# Patient Record
Sex: Female | Born: 1990 | Race: White | Hispanic: No | Marital: Single
Health system: Southern US, Community
[De-identification: ages and names within clinical notes are randomized; demographics above are authoritative.]

---

## 2019-04-17 DIAGNOSIS — E559 Vitamin D deficiency, unspecified: Secondary | ICD-10-CM | POA: Insufficient documentation

## 2019-11-14 ENCOUNTER — Ambulatory Visit: Payer: Commercial Managed Care - PPO | Admitting: Podiatry

## 2019-11-21 ENCOUNTER — Encounter: Payer: Self-pay | Admitting: Podiatry

## 2019-11-21 ENCOUNTER — Ambulatory Visit (INDEPENDENT_AMBULATORY_CARE_PROVIDER_SITE_OTHER): Payer: Commercial Managed Care - PPO

## 2019-11-21 ENCOUNTER — Other Ambulatory Visit: Payer: Self-pay

## 2019-11-21 ENCOUNTER — Ambulatory Visit (INDEPENDENT_AMBULATORY_CARE_PROVIDER_SITE_OTHER): Payer: Commercial Managed Care - PPO | Admitting: Podiatry

## 2019-11-21 DIAGNOSIS — M722 Plantar fascial fibromatosis: Secondary | ICD-10-CM

## 2019-11-21 DIAGNOSIS — M79671 Pain in right foot: Secondary | ICD-10-CM

## 2019-11-21 DIAGNOSIS — M79672 Pain in left foot: Secondary | ICD-10-CM

## 2019-11-21 DIAGNOSIS — D1724 Benign lipomatous neoplasm of skin and subcutaneous tissue of left leg: Secondary | ICD-10-CM

## 2019-11-21 DIAGNOSIS — D1723 Benign lipomatous neoplasm of skin and subcutaneous tissue of right leg: Secondary | ICD-10-CM | POA: Diagnosis not present

## 2019-11-21 MED ORDER — METHYLPREDNISOLONE 4 MG PO TBPK: ORAL_TABLET | ORAL | 0 refills | Status: DC

## 2019-11-21 NOTE — Progress Notes (Signed)
  Subjective:  Patient ID: Alexis Ortiz, female    DOB: 04/03/1990,  MRN: [id number]  Chief Complaint  Patient presents with  . Foot Problem    Bilateral medial painful masses 3 week duration no known injuries.   29 y.o. female presents with the above complaint. History confirmed with patient.   Objective:  Physical Exam: warm, good capillary refill, no trophic changes or ulcerative lesions, normal DP and PT pulses and normal sensory exam. Left Foot: POP medial calc tuber, large mass medial arch with fatty septae.  Right Foot: POP medial calc tuber, large mass medial arch with fatty septae.    No images are attached to the encounter.  Radiographs: X-ray of both feet: no fracture, dislocation, swelling or degenerative changes noted Assessment:   1. Pain in left foot   2. Pain in right foot   3. Lipoma of left lower extremity      Plan:  Patient was evaluated and treated and all questions answered.  Lipoma left foot -Order Ultrasound for further eval   Plantar fasciitis - Rx Medrol pack however do not be taken until after her ultrasound  Return in about 3 weeks (around 12/12/2019).

## 2019-11-27 ENCOUNTER — Inpatient Hospital Stay: Admission: RE | Admit: 2019-11-27 | Payer: Commercial Managed Care - PPO | Source: Ambulatory Visit

## 2019-11-28 ENCOUNTER — Ambulatory Visit
Admission: RE | Admit: 2019-11-28 | Discharge: 2019-11-28 | Disposition: A | Discharge status: Home / Self Care | Payer: Commercial Managed Care - PPO | Source: Ambulatory Visit | Attending: Podiatry | Admitting: Podiatry

## 2019-11-28 ENCOUNTER — Encounter: Payer: Self-pay | Admitting: General Practice

## 2019-11-28 DIAGNOSIS — D1724 Benign lipomatous neoplasm of skin and subcutaneous tissue of left leg: Secondary | ICD-10-CM

## 2019-12-02 ENCOUNTER — Encounter: Payer: Self-pay | Admitting: Podiatry

## 2019-12-02 ENCOUNTER — Ambulatory Visit (INDEPENDENT_AMBULATORY_CARE_PROVIDER_SITE_OTHER): Payer: Commercial Managed Care - PPO | Admitting: Podiatry

## 2019-12-02 ENCOUNTER — Other Ambulatory Visit: Payer: Self-pay

## 2019-12-02 DIAGNOSIS — M722 Plantar fascial fibromatosis: Secondary | ICD-10-CM | POA: Diagnosis not present

## 2019-12-02 DIAGNOSIS — M79672 Pain in left foot: Secondary | ICD-10-CM

## 2019-12-02 DIAGNOSIS — D1724 Benign lipomatous neoplasm of skin and subcutaneous tissue of left leg: Secondary | ICD-10-CM

## 2019-12-02 NOTE — Progress Notes (Addendum)
  Subjective:  Patient ID: Alexis Ortiz, female    DOB: 1990-12-09,  MRN: [id number]  No chief complaint on file.   29 y.o. female presents with the above complaint. History confirmed with patient. Here for [place] review and furhter care. States the heel still hurts and the medication didn't help that much.  Objective:  Physical Exam: warm, good capillary refill, no trophic changes or ulcerative lesions, normal DP and PT pulses and normal sensory exam. Left Foot: POP left midfoot soft-tissue mass  Right Foot: soft tisuse mass at medial arch   Assessment:   1. Lipoma of left lower extremity   2. Pain in left foot   3. Plantar fasciitis    Plan:  Patient was evaluated and treated and all questions answered.  Lipoma left foot -[place] reviewed again with patient. -Discussed proceeding with removal. Patient would like to proceed. -Patient has failed all conservative therapy and wishes to proceed with surgical intervention. All risks, benefits, and alternatives discussed with patient. No guarantees given. Consent reviewed and signed by patient. -Planned procedures: left foot excision of lipoma, injection of plantar fascia.   Plantar fasciitis -Not improved with the medrol pack. Discussed injection at same time of excision of mass. I think it is too early to consider surgery for this issue as we have not maximized conservative care.  No follow-ups on file.

## 2019-12-02 NOTE — Addendum Note (Signed)
Addended by: Hardie Pulley on: 12/02/2019 12:14 PM   Modules accepted: Level of Service

## 2019-12-03 ENCOUNTER — Telehealth: Payer: Self-pay

## 2019-12-03 ENCOUNTER — Encounter: Payer: Self-pay | Admitting: Podiatry

## 2019-12-03 NOTE — Telephone Encounter (Signed)
DOS 12/11/2019  EXC GANGLION/TUMOR LT - 95638 INJECTION HEEL LT - 20550  UMR EFFECTIVE DATE - 07/20/2019  PLAN DEDUCTIBLE - $2500.00 W/ $2500.00 REMAINING OUT OF POCKET - $5500.00 W/ $5500.00 REMAINING COPAY $0.00 COINSURANCE - 20%  PER JAY AY UMR NO PRECERT IS REQUIRED FOR CPT 28090 OR 75643. CALL REF # [id number]

## 2019-12-11 ENCOUNTER — Other Ambulatory Visit: Payer: Self-pay | Admitting: Podiatry

## 2019-12-11 ENCOUNTER — Encounter: Payer: Self-pay | Admitting: Podiatry

## 2019-12-11 ENCOUNTER — Other Ambulatory Visit: Payer: Self-pay | Admitting: Sports Medicine

## 2019-12-11 ENCOUNTER — Telehealth: Payer: Self-pay | Admitting: Podiatry

## 2019-12-11 DIAGNOSIS — M67472 Ganglion, left ankle and foot: Secondary | ICD-10-CM | POA: Diagnosis not present

## 2019-12-11 DIAGNOSIS — Z9889 Other specified postprocedural states: Secondary | ICD-10-CM

## 2019-12-11 DIAGNOSIS — M722 Plantar fascial fibromatosis: Secondary | ICD-10-CM | POA: Diagnosis not present

## 2019-12-11 MED ORDER — ONDANSETRON HCL 4 MG PO TABS: 4.0000 mg | ORAL_TABLET | Freq: Three times a day (TID) | ORAL | 0 refills | Status: DC | PRN

## 2019-12-11 MED ORDER — CEPHALEXIN 500 MG PO CAPS: 500.0000 mg | ORAL_CAPSULE | Freq: Two times a day (BID) | ORAL | 0 refills | Status: DC

## 2019-12-11 MED ORDER — HYDROCODONE-ACETAMINOPHEN 5-325 MG PO TABS: 1.0000 | ORAL_TABLET | Freq: Four times a day (QID) | ORAL | 0 refills | Status: DC | PRN

## 2019-12-11 MED ORDER — HYDROCODONE-ACETAMINOPHEN 5-325 MG PO TABS
1.0000 | ORAL_TABLET | Freq: Four times a day (QID) | ORAL | 0 refills | Status: DC | PRN
Start: 2019-12-11 — End: 2019-12-11

## 2019-12-11 MED ORDER — OXYCODONE-ACETAMINOPHEN 5-325 MG PO TABS: 1.0000 | ORAL_TABLET | ORAL | 0 refills | Status: DC | PRN

## 2019-12-11 NOTE — Progress Notes (Signed)
Resent post op meds to her pharmacy  -Dr. Cannon Kettle

## 2019-12-11 NOTE — Progress Notes (Signed)
Rx sent to pharmacy for outpatient surgery. °

## 2019-12-12 ENCOUNTER — Ambulatory Visit: Payer: Commercial Managed Care - PPO | Admitting: Podiatry

## 2019-12-12 ENCOUNTER — Telehealth: Payer: Self-pay | Admitting: Podiatry

## 2019-12-12 DIAGNOSIS — Z9889 Other specified postprocedural states: Secondary | ICD-10-CM

## 2019-12-12 MED ORDER — ONDANSETRON HCL 8 MG PO TABS: 8.0000 mg | ORAL_TABLET | Freq: Three times a day (TID) | ORAL | 0 refills | Status: DC | PRN

## 2019-12-12 MED ORDER — IBUPROFEN 800 MG PO TABS: 800.0000 mg | ORAL_TABLET | Freq: Three times a day (TID) | ORAL | 0 refills | Status: DC | PRN

## 2019-12-12 NOTE — Addendum Note (Signed)
Addended by: Hardie Pulley on: 12/12/2019 10:16 AM   Modules accepted: Orders

## 2019-12-12 NOTE — Telephone Encounter (Signed)
Patient said as soon as she started taking the prescribed medication she has been throwing up and was sick all night. She believes it the medication. Are there other options? She would like to switch pharmacy to [facility] in [place] on [address].

## 2019-12-15 ENCOUNTER — Telehealth: Payer: Self-pay | Admitting: Podiatry

## 2019-12-15 ENCOUNTER — Encounter: Payer: Self-pay | Admitting: Podiatry

## 2019-12-15 NOTE — Telephone Encounter (Signed)
Alexis Ortiz would like a note, to remain out of work until her next visit. She thought she would be able to go back to work, but she is still having pain. Please advise?

## 2019-12-15 NOTE — Telephone Encounter (Signed)
I'm fine with that. Let's keep her out 2 weeks

## 2019-12-16 ENCOUNTER — Ambulatory Visit: Payer: Commercial Managed Care - PPO | Admitting: Podiatry

## 2019-12-16 ENCOUNTER — Telehealth: Payer: Self-pay | Admitting: Podiatry

## 2019-12-16 NOTE — Telephone Encounter (Signed)
Pharmacy called stating they received an extra order for the oxyCODONE 5-325 MG. They would like to know if they need to fill it. Please advise.

## 2019-12-16 NOTE — Telephone Encounter (Signed)
Called and spoke to pharmacy no current issues with her pain medication rxes

## 2019-12-18 ENCOUNTER — Encounter: Payer: Self-pay | Admitting: Podiatry

## 2019-12-19 ENCOUNTER — Other Ambulatory Visit: Payer: Self-pay

## 2019-12-19 ENCOUNTER — Encounter: Payer: Self-pay | Admitting: Podiatry

## 2019-12-19 ENCOUNTER — Ambulatory Visit (INDEPENDENT_AMBULATORY_CARE_PROVIDER_SITE_OTHER): Payer: Commercial Managed Care - PPO | Admitting: Podiatry

## 2019-12-19 DIAGNOSIS — Z9889 Other specified postprocedural states: Secondary | ICD-10-CM

## 2019-12-19 DIAGNOSIS — D1724 Benign lipomatous neoplasm of skin and subcutaneous tissue of left leg: Secondary | ICD-10-CM

## 2019-12-19 MED ORDER — OXYCODONE-ACETAMINOPHEN 5-325 MG PO TABS
1.0000 | ORAL_TABLET | ORAL | 0 refills | Status: DC | PRN
Start: 2019-12-19 — End: 2020-06-09

## 2019-12-26 ENCOUNTER — Other Ambulatory Visit: Payer: Self-pay

## 2019-12-26 ENCOUNTER — Ambulatory Visit (INDEPENDENT_AMBULATORY_CARE_PROVIDER_SITE_OTHER): Payer: Commercial Managed Care - PPO | Admitting: Podiatry

## 2019-12-26 DIAGNOSIS — M722 Plantar fascial fibromatosis: Secondary | ICD-10-CM | POA: Diagnosis not present

## 2019-12-26 MED ORDER — MELOXICAM 15 MG PO TABS: 15.0000 mg | ORAL_TABLET | Freq: Every day | ORAL | 0 refills | Status: DC

## 2020-01-05 ENCOUNTER — Encounter: Payer: Self-pay | Admitting: Podiatry

## 2020-01-05 ENCOUNTER — Ambulatory Visit (INDEPENDENT_AMBULATORY_CARE_PROVIDER_SITE_OTHER): Payer: Commercial Managed Care - PPO | Admitting: Podiatry

## 2020-01-05 ENCOUNTER — Other Ambulatory Visit: Payer: Self-pay

## 2020-01-05 DIAGNOSIS — D1724 Benign lipomatous neoplasm of skin and subcutaneous tissue of left leg: Secondary | ICD-10-CM

## 2020-01-05 DIAGNOSIS — Z9889 Other specified postprocedural states: Secondary | ICD-10-CM

## 2020-01-05 MED ORDER — HYDROCODONE-ACETAMINOPHEN 5-325 MG PO TABS
0.5000 | ORAL_TABLET | Freq: Four times a day (QID) | ORAL | 0 refills | Status: DC | PRN
Start: 2020-01-05 — End: 2020-06-10

## 2020-01-09 ENCOUNTER — Encounter: Payer: Commercial Managed Care - PPO | Admitting: Podiatry

## 2020-01-11 NOTE — Progress Notes (Signed)
°  Subjective:  Patient ID: Alexis Ortiz, female    DOB: September 11, 1990,  MRN: [id number]  Chief Complaint  Patient presents with   Routine Post Op    the stitches are still in there and itches on the left     DOS: 12/11/19 Procedure: Excision of lipoma left foot, injection left heel   29 y.o. female presents with the above complaint.  States that the stitches are very itchy and she is ready for them to come out  Objective:  Physical Exam: tenderness at the surgical site, local edema noted and calf supple, nontender.  Pain palpation about the medial calc tuber Incision: healing well, no significant drainage, no dehiscence, no significant erythema    Assessment:   1. S/P foot surgery   2. Lipoma of left lower extremity     Plan:  Patient was evaluated and treated and all questions answered.  Post-operative State -Sutures removed -Steri-strips applied to the incision -WBAT in Surgical shoe -Pain medication refilled  Return in about 3 weeks (around 01/26/2020).

## 2020-01-11 NOTE — Progress Notes (Signed)
°  Subjective:  Patient ID: Alexis Ortiz, female    DOB: [birth year],  MRN: [id number]  Chief Complaint  Patient presents with   Routine Post Op    POV#2 DOS 9.30.2021 EXCISION GANGLION/TUMOR, INJECTION LT HEEL. Pt states surgical site is doing okay but primary concern is heel pain today. Denies fever/nausea/vomiting/chills.    DOS: 12/11/19 Procedure: Excision of lipoma left foot, injection left heel   29 y.o. female presents with the above complaint. History confirmed with patient.  States that the heel hurts more than the surgical site  Objective:  Physical Exam: tenderness at the surgical site, local edema noted and calf supple, nontender.  Pain palpation about the medial calc tuber Incision: healing well, no significant drainage, no dehiscence, no significant erythema    Assessment:   1. Plantar fasciitis     Plan:  Patient was evaluated and treated and all questions answered.  Post-operative State -Ok to start showering at this time. Advised they cannot soak. -Dressing applied consisting of band-aid -WBAT in Surgical shoe -Rx meloxicam  No follow-ups on file.

## 2020-01-11 NOTE — Progress Notes (Signed)
  Subjective:  Patient ID: Alexis Ortiz, female    DOB: 1990-06-03,  MRN: [id number]  Chief Complaint  Patient presents with  . Routine Post Op    POV#1 DOS 9.30.2021 EXCISION GANGLION/TUMOR, INJECTION LT HEEL. Pt states some tenderness. Denies fever/nausea/vomiting/chills.    DOS: 12/11/19 Procedure: Excision of lipoma left foot, injection left heel   29 y.o. female presents with the above complaint. History confirmed with patient.  States that the area is still tender and hurts pain otherwise mostly controlled.  Request refill of pain medication today  Objective:  Physical Exam: tenderness at the surgical site, local edema noted and calf supple, nontender. Incision: healing well, no significant drainage, no dehiscence, no significant erythema    Assessment:   1. S/P foot surgery   2. Lipoma of left lower extremity     Plan:  Patient was evaluated and treated and all questions answered.  Post-operative State -Dressing applied consisting of sterile gauze, kerlix and ACE bandage -WBAT in Surgical shoe -Pain medication refilled  Return in about 1 week (around 12/26/2019).

## 2020-01-15 ENCOUNTER — Encounter: Payer: Commercial Managed Care - PPO | Admitting: Podiatry

## 2020-01-26 ENCOUNTER — Encounter: Payer: Commercial Managed Care - PPO | Admitting: Podiatry

## 2020-01-26 ENCOUNTER — Encounter: Payer: Self-pay | Admitting: Podiatry

## 2020-01-26 ENCOUNTER — Ambulatory Visit (INDEPENDENT_AMBULATORY_CARE_PROVIDER_SITE_OTHER): Payer: Commercial Managed Care - PPO | Admitting: Podiatry

## 2020-01-26 ENCOUNTER — Other Ambulatory Visit: Payer: Self-pay

## 2020-01-26 DIAGNOSIS — M79671 Pain in right foot: Secondary | ICD-10-CM | POA: Diagnosis not present

## 2020-01-26 DIAGNOSIS — M79672 Pain in left foot: Secondary | ICD-10-CM | POA: Diagnosis not present

## 2020-01-26 DIAGNOSIS — M216X9 Other acquired deformities of unspecified foot: Secondary | ICD-10-CM | POA: Diagnosis not present

## 2020-01-26 DIAGNOSIS — M722 Plantar fascial fibromatosis: Secondary | ICD-10-CM | POA: Diagnosis not present

## 2020-01-29 ENCOUNTER — Other Ambulatory Visit: Payer: Self-pay | Admitting: Podiatry

## 2020-01-29 DIAGNOSIS — M722 Plantar fascial fibromatosis: Secondary | ICD-10-CM

## 2020-01-29 DIAGNOSIS — D1723 Benign lipomatous neoplasm of skin and subcutaneous tissue of right leg: Secondary | ICD-10-CM

## 2020-01-30 ENCOUNTER — Encounter: Payer: Self-pay | Admitting: Podiatry

## 2020-02-11 NOTE — Progress Notes (Signed)
  Subjective:  Patient ID: Alexis Ortiz, female    DOB: January 02, 1991,  MRN: [id number]  Chief Complaint  Patient presents with  . Routine Post Op    the left heel is still hurting and it feels like a pin in it and the injection did help    DOS: 12/11/19 Procedure: Excision of lipoma left foot, injection left heel   29 y.o. female presents with the above complaint.  Objective:  Physical Exam: tenderness at the surgical site, local edema noted and calf supple, nontender.  Pain palpation about the medial calc tuber Incision: healing well, no significant drainage, no dehiscence, no significant erythema  Assessment:   1. Plantar fasciitis   2. Pain in left foot   3. Pain in right foot   4. Equinus deformity of foot    Plan:  Patient was evaluated and treated and all questions answered.  Plantar fasciitis b/l -Still having pain in the left heel, would like to consider other options -Patient has failed all conservative therapy and wishes to proceed with surgical intervention. All risks, benefits, and alternatives discussed with patient. No guarantees given. Consent reviewed and signed by patient. -Planned procedures: Endoscopic plantar fasciotomy left, PRP injection bilat -Identified risk factors: none -DME dispensed for post-op use: none   No follow-ups on file.

## 2020-02-12 ENCOUNTER — Telehealth: Payer: Self-pay | Admitting: Podiatry

## 2020-02-12 NOTE — Telephone Encounter (Signed)
DOS: 02/25/2020  Procedures: Endoscopic Plantar Fasciotomy Lt (44715), Platelet Rich Plasma B/L (8063E), and Injections B/L (68548)  UMR/[employer] Effective From 07/20/2019 -  Deductible: $2,500 with $1,525.18 met and $974.82 remains. Out of Pocket: $5,500 with $1,737.56 met and $3,762.44 remains. CoInsurance: 20% Copay: $  Per Sam D No Prior Authorization or Referrals are required. However, 0232T is not covered as it is considered experimental treatment. Call reference [phone]

## 2020-02-13 ENCOUNTER — Telehealth: Payer: Self-pay

## 2020-02-13 NOTE — Telephone Encounter (Signed)
Noted thanks °

## 2020-02-13 NOTE — Telephone Encounter (Signed)
[place] called needing to reschedule her surgery with Dr. March Rummage on 02/25/2020. She stated her ride can't get off work that day. We rescheduled her to 03/10/2020. Notified Cynthia with [facility] and Dr. March Rummage

## 2020-02-23 ENCOUNTER — Telehealth: Payer: Self-pay | Admitting: Podiatry

## 2020-02-23 ENCOUNTER — Encounter: Payer: Self-pay | Admitting: Podiatry

## 2020-02-23 NOTE — Telephone Encounter (Signed)
Pt is requesting a work note for sx starting 03/08/2010 to 1\01/2021.

## 2020-02-23 NOTE — Telephone Encounter (Signed)
I'm fine with that Darrick Penna do you mind writing it?

## 2020-03-02 ENCOUNTER — Encounter: Payer: Commercial Managed Care - PPO | Admitting: Podiatry

## 2020-03-08 ENCOUNTER — Telehealth: Payer: Self-pay

## 2020-03-08 NOTE — Telephone Encounter (Signed)
Thanks for the update

## 2020-03-08 NOTE — Telephone Encounter (Signed)
Received call from [place] to reschedule her surgery with Dr. Samuella Cota on 03/10/2020. She tested positive for COVID on 03/05/2020. We have rescheduled her surgery to 03/24/2020. Notified Cynthia with [facility] and Dr. Samuella Cota

## 2020-03-09 ENCOUNTER — Encounter: Payer: Commercial Managed Care - PPO | Admitting: Podiatry

## 2020-03-10 ENCOUNTER — Encounter: Payer: Self-pay | Admitting: Podiatry

## 2020-03-15 ENCOUNTER — Encounter: Payer: Commercial Managed Care - PPO | Admitting: Podiatry

## 2020-03-16 ENCOUNTER — Ambulatory Visit: Payer: Commercial Managed Care - PPO | Admitting: Podiatry

## 2020-03-22 ENCOUNTER — Encounter: Payer: Commercial Managed Care - PPO | Admitting: Podiatry

## 2020-03-23 ENCOUNTER — Encounter: Payer: Commercial Managed Care - PPO | Admitting: Podiatry

## 2020-03-29 ENCOUNTER — Encounter: Payer: Commercial Managed Care - PPO | Admitting: Podiatry

## 2020-03-30 ENCOUNTER — Ambulatory Visit: Payer: Commercial Managed Care - PPO | Admitting: Podiatry

## 2020-04-02 ENCOUNTER — Ambulatory Visit: Payer: Commercial Managed Care - PPO | Admitting: Podiatry

## 2020-04-05 ENCOUNTER — Encounter: Payer: Commercial Managed Care - PPO | Admitting: Podiatry

## 2020-04-09 ENCOUNTER — Other Ambulatory Visit: Payer: Self-pay

## 2020-04-09 ENCOUNTER — Ambulatory Visit (INDEPENDENT_AMBULATORY_CARE_PROVIDER_SITE_OTHER): Payer: Commercial Managed Care - PPO | Admitting: Podiatry

## 2020-04-09 DIAGNOSIS — M722 Plantar fascial fibromatosis: Secondary | ICD-10-CM

## 2020-04-09 DIAGNOSIS — M79672 Pain in left foot: Secondary | ICD-10-CM

## 2020-04-09 NOTE — Progress Notes (Signed)
  Subjective:  Patient ID: Alexis Ortiz, female    DOB: 1991/03/06,  MRN: [id number]  Chief Complaint  Patient presents with  . Plantar Fasciitis    Pt states severe PF pain left foot.    DOS: 12/11/19 Procedure: Excision of lipoma left foot, injection left heel   30 y.o. female presents with the above complaint. States she wanted to make sure that she did not mess up her heel more. Has to reschedule her surgery again. Objective:  Physical Exam: tenderness at the surgical site, local edema noted and calf supple, nontender.  Pain palpation about the medial calc tuber Incision: healed Assessment:   1. Plantar fasciitis   2. Pain in left foot    Plan:  Patient was evaluated and treated and all questions answered.  Plantar fasciitis b/l -Still having pain in the left heel, would like to consider other options -Pending procedures: Endoscopic plantar fasciotomy left, PRP injection bilat -Dispensed plantar fascial brace today -Should pain persist would consider repeating medrol pack.  No follow-ups on file.

## 2020-04-19 ENCOUNTER — Encounter: Payer: Commercial Managed Care - PPO | Admitting: Podiatry

## 2020-04-26 ENCOUNTER — Encounter: Payer: Commercial Managed Care - PPO | Admitting: Podiatry

## 2020-05-10 ENCOUNTER — Encounter: Payer: Commercial Managed Care - PPO | Admitting: Podiatry

## 2020-06-09 ENCOUNTER — Telehealth: Payer: Self-pay | Admitting: Podiatry

## 2020-06-09 ENCOUNTER — Other Ambulatory Visit: Payer: Self-pay | Admitting: Podiatry

## 2020-06-09 DIAGNOSIS — Z9889 Other specified postprocedural states: Secondary | ICD-10-CM

## 2020-06-09 DIAGNOSIS — M722 Plantar fascial fibromatosis: Secondary | ICD-10-CM

## 2020-06-09 MED ORDER — PROMETHAZINE HCL 25 MG PO TABS: 25.0000 mg | ORAL_TABLET | Freq: Three times a day (TID) | ORAL | 0 refills | Status: DC | PRN

## 2020-06-09 MED ORDER — CEPHALEXIN 500 MG PO CAPS: 500.0000 mg | ORAL_CAPSULE | Freq: Two times a day (BID) | ORAL | 0 refills | Status: DC

## 2020-06-09 MED ORDER — OXYCODONE-ACETAMINOPHEN 5-325 MG PO TABS
1.0000 | ORAL_TABLET | ORAL | 0 refills | Status: DC | PRN
Start: 2020-06-09 — End: 2020-09-06

## 2020-06-09 NOTE — Telephone Encounter (Signed)
Patient stated the oxycodone is making her itch and requests another prescription is prescribed to replace, Please Advise

## 2020-06-09 NOTE — Progress Notes (Signed)
Rx sent to pharmacy for outpatient surgery. °

## 2020-06-10 ENCOUNTER — Telehealth: Payer: Self-pay | Admitting: Podiatry

## 2020-06-10 ENCOUNTER — Other Ambulatory Visit: Payer: Self-pay | Admitting: Podiatry

## 2020-06-10 MED ORDER — HYDROCODONE-ACETAMINOPHEN 5-325 MG PO TABS
0.5000 | ORAL_TABLET | Freq: Four times a day (QID) | ORAL | 0 refills | Status: DC | PRN
Start: 2020-06-10 — End: 2020-06-18

## 2020-06-10 NOTE — Telephone Encounter (Signed)
Rx was sent  

## 2020-06-10 NOTE — Telephone Encounter (Signed)
Patient called in stating she had reaction to oxycodone and requested another pain med, Please Advise

## 2020-06-10 NOTE — Progress Notes (Signed)
Rx switched to norco

## 2020-06-10 NOTE — Telephone Encounter (Signed)
Sent new Rx.

## 2020-06-10 NOTE — Telephone Encounter (Signed)
Called patient to inform another prescription has been called in and should she experiencing itching to take benadryl with it per March Rummage

## 2020-06-14 ENCOUNTER — Encounter: Payer: Commercial Managed Care - PPO | Admitting: Podiatry

## 2020-06-14 ENCOUNTER — Telehealth: Payer: Self-pay | Admitting: *Deleted

## 2020-06-14 NOTE — Telephone Encounter (Signed)
I called and spoke with the patient and stated that I was calling to see how the patient was doing after surgery and patient stated that she was doing ok and was using a crutch and the nerve block wore off and patient stated that she had pain right after surgery and there was not any fever or chills and not any nausea and was icing and elevating and stated to call the office if any concerns or questions. Alexis Ortiz

## 2020-06-15 ENCOUNTER — Encounter: Payer: Commercial Managed Care - PPO | Admitting: Podiatry

## 2020-06-18 ENCOUNTER — Ambulatory Visit (INDEPENDENT_AMBULATORY_CARE_PROVIDER_SITE_OTHER): Payer: Commercial Managed Care - PPO | Admitting: Podiatry

## 2020-06-18 ENCOUNTER — Encounter: Payer: Self-pay | Admitting: Podiatry

## 2020-06-18 ENCOUNTER — Other Ambulatory Visit: Payer: Self-pay

## 2020-06-18 DIAGNOSIS — Z9889 Other specified postprocedural states: Secondary | ICD-10-CM

## 2020-06-18 DIAGNOSIS — M722 Plantar fascial fibromatosis: Secondary | ICD-10-CM

## 2020-06-18 MED ORDER — HYDROCODONE-ACETAMINOPHEN 10-325 MG PO TABS: 1.0000 | ORAL_TABLET | ORAL | 0 refills | Status: DC | PRN

## 2020-06-18 NOTE — Progress Notes (Signed)
  Subjective:  Patient ID: Alexis Ortiz, female    DOB: 1990-12-18,  MRN: [id number]  Chief Complaint  Patient presents with  . Routine Post Op    rescheduled-POV #1 DOS 06/09/2020 EPF LT, PLASMA RICH PLASMA B/L  pt aware of [place] office/reb    DOS: 06/09/20 Procedure: EPF left, PRP injection bilat   30 y.o. female presents with the above complaint. History confirmed with patient. Having a lot of pain and it is not controlled with current regimen  Objective:  Physical Exam: tenderness at the surgical site, local edema noted and calf supple, nontender. Incision: healing well, no significant drainage, no dehiscence, no significant erythema     Assessment:   1. Plantar fasciitis   2. S/P foot surgery     Plan:  Patient was evaluated and treated and all questions answered.  Post-operative State -Ok to start showering at this time. Advised they cannot soak. -Dressing applied consisting of silvadene and band-aid -WBAT in CAM boot -Pain medication refilled  Return in about 1 week (around 06/25/2020) for Post-Op (No XRs).

## 2020-07-01 ENCOUNTER — Ambulatory Visit (INDEPENDENT_AMBULATORY_CARE_PROVIDER_SITE_OTHER): Payer: Commercial Managed Care - PPO | Admitting: Podiatry

## 2020-07-01 ENCOUNTER — Other Ambulatory Visit: Payer: Self-pay

## 2020-07-01 DIAGNOSIS — M722 Plantar fascial fibromatosis: Secondary | ICD-10-CM

## 2020-07-01 DIAGNOSIS — Z9889 Other specified postprocedural states: Secondary | ICD-10-CM

## 2020-07-01 MED ORDER — HYDROCODONE-ACETAMINOPHEN 10-325 MG PO TABS
1.0000 | ORAL_TABLET | ORAL | 0 refills | Status: DC | PRN
Start: 2020-07-01 — End: 2020-09-06

## 2020-07-01 NOTE — Progress Notes (Signed)
  Subjective:  Patient ID: Alexis Ortiz, female    DOB: Jan 08, 1991,  MRN: [id number]  Chief Complaint  Patient presents with  . Routine Post Op    POV#2 -pt denies N/V/F?Ch - pt states,' pain is there, it hurts worse wthan b4 sx." - worse at night time; 7/10." Tx: boot, hydro, neosporin and elvation    DOS: 06/09/20 Procedure: EPF left, PRP injection bilat   30 y.o. female presents with the above complaint. History confirmed with patient. Having a lot of pain and it is not controlled with current regimen   Objective:  Physical Exam: tenderness at the surgical site, local edema noted and calf supple, nontender. Incision: healing well, no significant drainage, no dehiscence, no significant erythema  Assessment:   1. Plantar fasciitis   2. S/P foot surgery    Plan:  Patient was evaluated and treated and all questions answered.  Post-operative State -Ok to start showering at this time. Advised they cannot soak. -Dressing applied consisting of silvadene and band-aid -WBAT in CAM boot -Pain medication refilled  Return in about 2 weeks (around 07/15/2020).

## 2020-07-02 ENCOUNTER — Encounter: Payer: Commercial Managed Care - PPO | Admitting: Podiatry

## 2020-07-19 ENCOUNTER — Other Ambulatory Visit: Payer: Self-pay

## 2020-07-19 ENCOUNTER — Ambulatory Visit (INDEPENDENT_AMBULATORY_CARE_PROVIDER_SITE_OTHER): Payer: Commercial Managed Care - PPO | Admitting: Podiatry

## 2020-07-19 DIAGNOSIS — M216X9 Other acquired deformities of unspecified foot: Secondary | ICD-10-CM

## 2020-07-19 DIAGNOSIS — M722 Plantar fascial fibromatosis: Secondary | ICD-10-CM

## 2020-07-19 NOTE — Progress Notes (Signed)
  Subjective:  Patient ID: Hilton Cork, female    DOB: November 13, 1990,  MRN: [id number]  Chief Complaint  Patient presents with  . Routine Post Op    POV#3 -pt denies N/V/F/Ch Tx: none -pt states," it still hurts, hurts underneath toes; 7/10 throbbing pain." - no redness -w/ swelling- shoes not comfortable     DOS: 06/09/20 Procedure: EPF left, PRP injection bilat   30 y.o. female presents with the above complaint. History confirmed with patient.    Objective:  Physical Exam: tenderness at the surgical site, local edema noted and calf supple, nontender. POP dorsal forefoot plantarly, posterior Achilles. Incision: healed.  Assessment:   1. Plantar fasciitis   2. Equinus deformity of foot    Plan:  Patient was evaluated and treated and all questions answered.  Post-operative State -I do think most of her symptoms are from equinus at this point. -Continue stretching. Recc ProStretch -Should issues persist consider Achilles lengthening L  Return in about 4 weeks (around 08/16/2020) for Post-Op (No XRs).

## 2020-07-19 NOTE — Patient Instructions (Signed)
Recommend on [employer]:  Physiological scientist for Plantar Fasciitis, Achilles Tendonitis and Tight Calves

## 2020-08-16 ENCOUNTER — Encounter: Payer: Commercial Managed Care - PPO | Admitting: Podiatry

## 2020-08-23 ENCOUNTER — Encounter: Payer: Commercial Managed Care - PPO | Admitting: Podiatry

## 2020-09-02 ENCOUNTER — Ambulatory Visit (INDEPENDENT_AMBULATORY_CARE_PROVIDER_SITE_OTHER): Payer: Commercial Managed Care - PPO | Admitting: Podiatry

## 2020-09-02 DIAGNOSIS — Z5329 Procedure and treatment not carried out because of patient's decision for other reasons: Secondary | ICD-10-CM

## 2020-09-02 NOTE — Progress Notes (Signed)
No show for appt. 

## 2020-09-06 ENCOUNTER — Ambulatory Visit (INDEPENDENT_AMBULATORY_CARE_PROVIDER_SITE_OTHER): Payer: Commercial Managed Care - PPO | Admitting: Podiatry

## 2020-09-06 ENCOUNTER — Other Ambulatory Visit: Payer: Self-pay

## 2020-09-06 ENCOUNTER — Encounter: Payer: Self-pay | Admitting: Podiatry

## 2020-09-06 DIAGNOSIS — M722 Plantar fascial fibromatosis: Secondary | ICD-10-CM

## 2020-09-06 NOTE — Progress Notes (Signed)
  Subjective:  Patient ID: Alexis Ortiz, female    DOB: Feb 19, 1991,  MRN: [id number]  Chief Complaint  Patient presents with   Routine Post Op    I am still hurting and there is not any change and just have been dealing with it and the stretching has not really helped and I have done that all the time     DOS: 06/09/20 Procedure: EPF left, PRP injection bilat   30 y.o. female presents with the above complaint. History confirmed with patient.    Objective:  Physical Exam: tenderness at the surgical site, local edema noted and calf supple, nontender. POP dorsal forefoot plantarly, posterior Achilles. Incision: healed.  Assessment:   1. Plantar fasciitis    Plan:  Patient was evaluated and treated and all questions answered.  Post-operative State -She has residual equinus and pain is not improved. At this point trial PT for 6 weeks. Will place referral. Should issues persist consider Achilles lengthening L, possible EPF Right concomitantly.  Return in about 6 weeks (around 10/18/2020) for Plantar fasciitis, Left.

## 2020-10-18 ENCOUNTER — Other Ambulatory Visit: Payer: Self-pay

## 2020-10-18 ENCOUNTER — Ambulatory Visit (INDEPENDENT_AMBULATORY_CARE_PROVIDER_SITE_OTHER): Payer: Commercial Managed Care - PPO | Admitting: Podiatry

## 2020-10-18 ENCOUNTER — Encounter: Payer: Self-pay | Admitting: Podiatry

## 2020-10-18 DIAGNOSIS — M216X9 Other acquired deformities of unspecified foot: Secondary | ICD-10-CM | POA: Diagnosis not present

## 2020-10-18 DIAGNOSIS — M722 Plantar fascial fibromatosis: Secondary | ICD-10-CM | POA: Diagnosis not present

## 2020-10-18 DIAGNOSIS — M79671 Pain in right foot: Secondary | ICD-10-CM

## 2020-10-18 DIAGNOSIS — M79672 Pain in left foot: Secondary | ICD-10-CM

## 2020-10-18 NOTE — Progress Notes (Signed)
  Subjective:  Patient ID: Alexis Ortiz, female    DOB: [birth year],  MRN: [id number]  Chief Complaint  Patient presents with   Plantar Fasciitis    F/U BL PF --pt states," they still hurt, I think a little worse than last time; 5-6/10.' - w/ swelling at Lt - hard to bend toes with pain tx: PT     DOS: 06/09/20 Procedure: EPF left, PRP injection bilat   30 y.o. female presents with the above complaint. History confirmed with patient.    Objective:  Physical Exam: Mild tenderness palpation about the medial calcaneal tuber left, moderate pain palpation about the medial calcaneal tuber right.  Decreased ankle joint range of motion bilaterally Incision: healed.  Assessment:   1. Plantar fasciitis   2. Equinus deformity of foot   3. Pain in left foot   4. Pain in right foot     Plan:  Patient was evaluated and treated and all questions answered.  Post-operative State -Patient is still having pain would like to consider surgical intervention.  We discussed treatment options and plan for surgery should her pain not completely resolve with PT.  Unfortunately she is not doing much else with PT other than the stretching and range of motion exercises.  -Patient has failed all conservative therapy and wishes to proceed with surgical intervention. All risks, benefits, and alternatives discussed with patient. No guarantees given. Consent reviewed and signed by patient. -Planned procedures: Right foot endoscopic plantar fasciotomy left foot mirodebridement of plantar fascia; injection of platelet rich plasma both heels, left Achilles lengthening with gastrocnemius recession -ASA 2 - Patient with mild systemic disease with no functional limitations -Post-op anticoagulation: chemoprophylaxis not indicated -DME dispensed for post-op use: none    Return for Post-op Care.

## 2020-10-19 ENCOUNTER — Telehealth: Payer: Self-pay

## 2020-10-19 NOTE — Telephone Encounter (Signed)
Received surgery paperwork from [facility] office. Left a message for Alexis Ortiz to call so we can schedule her surgery.

## 2020-11-11 ENCOUNTER — Telehealth: Payer: Self-pay | Admitting: *Deleted

## 2020-11-11 NOTE — Telephone Encounter (Signed)
Patient is calling because she is requesting a sooner appointment (11/19/20)or something for her foot pain. Pain is causing knee to hurt also hurting with pressure, esp at night.Please advise.

## 2020-11-12 ENCOUNTER — Encounter: Payer: Self-pay | Admitting: Podiatry

## 2020-11-12 ENCOUNTER — Ambulatory Visit (INDEPENDENT_AMBULATORY_CARE_PROVIDER_SITE_OTHER): Payer: Commercial Managed Care - PPO

## 2020-11-12 ENCOUNTER — Other Ambulatory Visit: Payer: Self-pay | Admitting: Podiatry

## 2020-11-12 ENCOUNTER — Other Ambulatory Visit: Payer: Self-pay

## 2020-11-12 ENCOUNTER — Ambulatory Visit (INDEPENDENT_AMBULATORY_CARE_PROVIDER_SITE_OTHER): Payer: Commercial Managed Care - PPO | Admitting: Podiatry

## 2020-11-12 DIAGNOSIS — M216X9 Other acquired deformities of unspecified foot: Secondary | ICD-10-CM | POA: Diagnosis not present

## 2020-11-12 DIAGNOSIS — F430 Acute stress reaction: Secondary | ICD-10-CM

## 2020-11-12 DIAGNOSIS — M722 Plantar fascial fibromatosis: Secondary | ICD-10-CM

## 2020-11-12 DIAGNOSIS — M8430XA Stress fracture, unspecified site, initial encounter for fracture: Secondary | ICD-10-CM

## 2020-11-12 MED ORDER — METHYLPREDNISOLONE 4 MG PO TBPK: ORAL_TABLET | ORAL | 0 refills | Status: DC

## 2020-11-12 NOTE — Progress Notes (Signed)
  Subjective:  Patient ID: Alexis Ortiz, female    DOB: November 06, 1990,  MRN: [id number]  Chief Complaint  Patient presents with   Leg Problem    Entire left leg painful down to foot and up to hip. Pain with rotation of foot, pain sleeping on side. No known injuries. Began 29th of August.    DOS: 06/09/20 Procedure: EPF left, PRP injection bilat   30 y.o. female presents with the above complaint. History confirmed with patient.    Objective:  Physical Exam: Mild tenderness palpation about the medial calcaneal tuber left, moderate pain palpation about the medial calcaneal tuber right.  Decreased ankle joint range of motion bilaterally.  Pain and edema left heel and ankle Incision: healed.  Assessment:   1. Stress reaction of bone   2. Plantar fasciitis   3. Equinus deformity of foot     Plan:  Patient was evaluated and treated and all questions answered.   Stress reaction of bone -XR reviewed with patient.  No acute fractures dislocations. -Will Rx Medrol pack to reduce inflammation. -Follow-up in 1 month for recheck    No follow-ups on file.

## 2020-11-16 ENCOUNTER — Telehealth: Payer: Self-pay | Admitting: Podiatry

## 2020-11-16 ENCOUNTER — Encounter: Payer: Self-pay | Admitting: Podiatry

## 2020-11-16 NOTE — Telephone Encounter (Signed)
Pt is wanting letter stating she is unable to wear shoes at work that have backs due to it causing swelling and discomfort.

## 2020-11-16 NOTE — Telephone Encounter (Signed)
I'm fine with that can you write it?

## 2020-11-19 ENCOUNTER — Ambulatory Visit: Payer: Commercial Managed Care - PPO | Admitting: Podiatry

## 2020-11-23 ENCOUNTER — Ambulatory Visit: Payer: Commercial Managed Care - PPO | Admitting: Podiatry

## 2020-11-30 ENCOUNTER — Ambulatory Visit (INDEPENDENT_AMBULATORY_CARE_PROVIDER_SITE_OTHER): Payer: Commercial Managed Care - PPO | Admitting: Podiatry

## 2020-11-30 ENCOUNTER — Other Ambulatory Visit: Payer: Self-pay

## 2020-11-30 ENCOUNTER — Encounter: Payer: Self-pay | Admitting: Podiatry

## 2020-11-30 DIAGNOSIS — M722 Plantar fascial fibromatosis: Secondary | ICD-10-CM | POA: Diagnosis not present

## 2020-11-30 DIAGNOSIS — M216X9 Other acquired deformities of unspecified foot: Secondary | ICD-10-CM

## 2020-11-30 DIAGNOSIS — M8430XA Stress fracture, unspecified site, initial encounter for fracture: Secondary | ICD-10-CM | POA: Diagnosis not present

## 2020-11-30 NOTE — Progress Notes (Signed)
  Subjective:  Patient ID: Alexis Ortiz, female    DOB: [birth year],  MRN: [id number]  Chief Complaint  Patient presents with   Plantar Fasciitis    3 week follow up left, prp injection bilateral    DOS: 06/09/20 Procedure: EPF left, PRP injection bilat   30 y.o. female presents with the above complaint. History confirmed with patient. States the medication did not help much. Still having pain when she moves her left ankle up and down. Denies other pedal issues.   Objective:  Physical Exam: Mild tenderness palpation about the medial calcaneal tuber left, moderate pain palpation about the medial calcaneal tuber right.  Decreased ankle joint range of motion bilaterally.  Pain and edema left heel and ankle, dorsal midfoot. Pain on attempted ankle ROM Incision: healed.  Assessment:   1. Stress reaction of bone   2. Plantar fasciitis   3. Equinus deformity of foot      Plan:  Patient was evaluated and treated and all questions answered.   Stress reaction of bone -She is still having a lot of pain to the left foot. While she would likely benefit from surgical intervention as planned I am concerned about her continued pain and swelling in the forefoot. Will have patient offload in a CAM boot for several weeks and then reassess need for surgical intervention. May need MRI to eval forefoot.    No follow-ups on file.

## 2020-12-09 NOTE — Telephone Encounter (Signed)
error 

## 2020-12-13 DIAGNOSIS — M722 Plantar fascial fibromatosis: Secondary | ICD-10-CM | POA: Insufficient documentation

## 2020-12-16 ENCOUNTER — Ambulatory Visit (INDEPENDENT_AMBULATORY_CARE_PROVIDER_SITE_OTHER): Payer: Commercial Managed Care - PPO | Admitting: Podiatry

## 2020-12-16 ENCOUNTER — Encounter: Payer: Self-pay | Admitting: Podiatry

## 2020-12-16 ENCOUNTER — Other Ambulatory Visit: Payer: Self-pay

## 2020-12-16 DIAGNOSIS — M8430XA Stress fracture, unspecified site, initial encounter for fracture: Secondary | ICD-10-CM | POA: Diagnosis not present

## 2020-12-16 DIAGNOSIS — M216X9 Other acquired deformities of unspecified foot: Secondary | ICD-10-CM | POA: Diagnosis not present

## 2020-12-16 DIAGNOSIS — M722 Plantar fascial fibromatosis: Secondary | ICD-10-CM

## 2020-12-23 NOTE — Progress Notes (Signed)
  Subjective:  Patient ID: Alexis Ortiz, female    DOB: 01-10-1991,  MRN: [id number]  Chief Complaint  Patient presents with   Foot Pain    The left foot is about the same and the boot kinda made it hurt worse and is touchy and does swell some     DOS: 06/09/20 Procedure: EPF left, PRP injection bilat   30 y.o. female presents with the above complaint. History confirmed with patient.  Pain essentially unchanged she still has a lot of pain in the midfoot and metatarsal area   Objective:  Physical Exam: Mild tenderness palpation about the medial calcaneal tuber left, moderate pain palpation about the medial calcaneal tuber right.  Decreased ankle joint range of motion bilaterally.  Pain and edema left heel and ankle, dorsal midfoot. Pain on attempted ankle ROM Incision: healed.  Assessment:   1. Stress reaction of bone   2. Plantar fasciitis   3. Equinus deformity of foot    Plan:  Patient was evaluated and treated and all questions answered.   Stress reaction of bone -Pain persist I am a little wary of proceeding with surgery while she is having such diffuse pain in the midfoot.  We will order MRI to evaluate midfoot rule out stress fracture thereafter can proceed with surgical intervention.  Hold off injections while awaiting MRI   Return in about 3 weeks (around 01/06/2021) for MRI f/u.

## 2020-12-26 ENCOUNTER — Ambulatory Visit
Admission: RE | Admit: 2020-12-26 | Discharge: 2020-12-26 | Disposition: A | Discharge status: Home / Self Care | Payer: Commercial Managed Care - PPO | Source: Ambulatory Visit | Attending: Podiatry | Admitting: Podiatry

## 2020-12-26 ENCOUNTER — Other Ambulatory Visit: Payer: Self-pay

## 2020-12-26 DIAGNOSIS — M8430XA Stress fracture, unspecified site, initial encounter for fracture: Secondary | ICD-10-CM

## 2020-12-28 ENCOUNTER — Ambulatory Visit (INDEPENDENT_AMBULATORY_CARE_PROVIDER_SITE_OTHER): Payer: Commercial Managed Care - PPO | Admitting: Podiatry

## 2020-12-28 ENCOUNTER — Other Ambulatory Visit: Payer: Self-pay

## 2020-12-28 ENCOUNTER — Encounter: Payer: Self-pay | Admitting: Podiatry

## 2020-12-28 DIAGNOSIS — M7752 Other enthesopathy of left foot: Secondary | ICD-10-CM | POA: Diagnosis not present

## 2020-12-28 DIAGNOSIS — M7661 Achilles tendinitis, right leg: Secondary | ICD-10-CM

## 2020-12-28 DIAGNOSIS — M722 Plantar fascial fibromatosis: Secondary | ICD-10-CM | POA: Diagnosis not present

## 2020-12-28 MED ORDER — METHYLPREDNISOLONE 4 MG PO TBPK: ORAL_TABLET | ORAL | 0 refills | Status: AC

## 2020-12-28 NOTE — Progress Notes (Signed)
  Subjective:  Patient ID: Alexis Ortiz, female    DOB: January 17, 1991,  MRN: [id number]  Chief Complaint  Patient presents with   Foot Pain    Follow up left foot pain. Pt states pain has no improved or changed. Pt states she has some tension in her right achilles due to favoring her right foot more.     DOS: 06/09/20 Procedure: EPF left, PRP injection bilat   30 y.o. female presents with the above complaint. History confirmed with patient. Had MRI performed here for review.   Objective:  Physical Exam: Mild tenderness palpation about the medial calcaneal tuber left, moderate pain palpation about the medial calcaneal tuber right.  Decreased ankle joint range of motion bilaterally.  Pain and edema left heel and ankle, dorsal midfoot. Pain on attempted ankle ROM Incision: healed.  Assessment:   1. Plantar fasciitis   2. Bursitis of intermetatarsal bursa of left foot   3. Tendonitis, Achilles, right    Plan:  Patient was evaluated and treated and all questions answered.   Intermetarsal bursitis left, plantar fasciitis left, Achilles tendonitis right -Rx medrol pack for residual inflammation. If right side improves we can proceed with discussion of left foot surgery - I am concerned if we proceed with left foot with right foot hurting this could worsen due to increased demand. Rx medrol pack today and will f/u in 3 weeks for possible surgical planning. Previous plan: Right foot endoscopic plantar fasciotomy left foot mirodebridement of plantar fascia; injection of platelet rich plasma both heels, left Achilles lengthening with gastrocnemius recession  Return in about 4 weeks (around 01/25/2021).

## 2021-01-25 ENCOUNTER — Other Ambulatory Visit: Payer: Self-pay

## 2021-01-25 ENCOUNTER — Ambulatory Visit (INDEPENDENT_AMBULATORY_CARE_PROVIDER_SITE_OTHER): Payer: Commercial Managed Care - PPO | Admitting: Podiatry

## 2021-01-25 DIAGNOSIS — M722 Plantar fascial fibromatosis: Secondary | ICD-10-CM | POA: Diagnosis not present

## 2021-01-25 DIAGNOSIS — M216X9 Other acquired deformities of unspecified foot: Secondary | ICD-10-CM | POA: Diagnosis not present

## 2021-01-25 DIAGNOSIS — M7661 Achilles tendinitis, right leg: Secondary | ICD-10-CM

## 2021-01-25 DIAGNOSIS — M79671 Pain in right foot: Secondary | ICD-10-CM

## 2021-01-25 DIAGNOSIS — M79672 Pain in left foot: Secondary | ICD-10-CM

## 2021-01-25 NOTE — Progress Notes (Signed)
  Subjective:  Patient ID: Alexis Ortiz, female    DOB: 1990-08-20,  MRN: [id number]  No chief complaint on file.   DOS: 06/09/20 Procedure: EPF left, PRP injection bilat   30 y.o. female presents with the above complaint. History confirmed with patient.  States this is the steroid pack she is doing a little bit better and has having less pain in the ball the foot but the heels still the same abdomen right side feels like it is pulling.   Objective:  Physical Exam: Pain palpation about both medial calcaneal tuber's.  Decreased ankle joint range of motion bilaterally.  Pain and edema left heel and ankle, dorsal midfoot. Pain on attempted ankle ROM pain right posterior Achilles  Incision: healed.  Assessment:   1. Plantar fasciitis   2. Tendonitis, Achilles, right   3. Equinus deformity of foot   4. Pain in right foot   5. Pain in left foot     Plan:  Patient was evaluated and treated and all questions answered.   Intermetarsal bursitis left, plantar fasciitis left, Achilles tendonitis right -She is having less pain in the ball of the foot today.  She still having pain to the left heel.  We discussed that she would benefit from surgical intervention.  She would like to focus on the left foot.  I think she would benefit from repeat endoscopic plantar fasciotomy and perhaps incised more of the plantar fascia.  She is having pain to the right heel and right Achilles tendon area and so I think she would benefit from PRP injection at these areas on the right foot as well as the left plantar fascial  No follow-ups on file.

## 2021-03-22 ENCOUNTER — Ambulatory Visit: Payer: Commercial Managed Care - PPO | Admitting: Podiatry

## 2021-04-12 ENCOUNTER — Ambulatory Visit (INDEPENDENT_AMBULATORY_CARE_PROVIDER_SITE_OTHER): Payer: Commercial Managed Care - PPO | Admitting: Podiatry

## 2021-04-12 ENCOUNTER — Encounter: Payer: Self-pay | Admitting: Podiatry

## 2021-04-12 ENCOUNTER — Other Ambulatory Visit: Payer: Self-pay

## 2021-04-12 ENCOUNTER — Telehealth: Payer: Self-pay | Admitting: *Deleted

## 2021-04-12 ENCOUNTER — Other Ambulatory Visit: Payer: Self-pay | Admitting: *Deleted

## 2021-04-12 DIAGNOSIS — M722 Plantar fascial fibromatosis: Secondary | ICD-10-CM

## 2021-04-12 DIAGNOSIS — M216X9 Other acquired deformities of unspecified foot: Secondary | ICD-10-CM | POA: Diagnosis not present

## 2021-04-12 DIAGNOSIS — M7661 Achilles tendinitis, right leg: Secondary | ICD-10-CM

## 2021-04-12 NOTE — Telephone Encounter (Signed)
Faxed order to [facility], received confirmation 04/12/21.

## 2021-04-12 NOTE — Telephone Encounter (Signed)
[facility] is calling for order(PT) for patient. The patient came upstairs ready to schedule and they do not have any information. Please advise.

## 2021-04-19 NOTE — Progress Notes (Signed)
°  Subjective:  Patient ID: Alexis Ortiz, female    DOB: 05-Aug-1990,  MRN: [id number]  Chief Complaint  Patient presents with   Follow-up    Pt reports she is having flare up- bilat foot    DOS: 06/09/20 Procedure: EPF left, PRP injection bilat   31 y.o. female presents with the above complaint. History confirmed with patient.  Both feet continue to hurt and are tight.  Objective:  Physical Exam: Pain palpation about both medial calcaneal tuber's.  Decreased ankle joint range of motion bilaterally.  Pain and edema left heel and ankle, dorsal midfoot. Pain on attempted ankle ROM pain right posterior Achilles  Incision: healed.  Assessment:   1. Plantar fasciitis   2. Tendonitis, Achilles, right   3. Equinus deformity of foot      Plan:  Patient was evaluated and treated and all questions answered.   Intermetarsal bursitis left, plantar fasciitis left, Achilles tendonitis right -Discussed physical therapy at this point. Consider iontophoresis. I think she may still need surgery (she is unable to do this now regardless) but I am concerned that she has continued tightness. Will try therapy for a few weeks and discuss possible surgery thereafter.   Return in about 4 weeks (around 05/10/2021) for Plantar fasciitis, Tendonitis.

## 2021-05-03 ENCOUNTER — Other Ambulatory Visit: Payer: Self-pay

## 2021-05-03 ENCOUNTER — Ambulatory Visit: Payer: Commercial Managed Care - PPO | Admitting: Podiatry

## 2021-05-03 ENCOUNTER — Ambulatory Visit (INDEPENDENT_AMBULATORY_CARE_PROVIDER_SITE_OTHER): Payer: Commercial Managed Care - PPO | Admitting: Podiatry

## 2021-05-03 ENCOUNTER — Encounter: Payer: Self-pay | Admitting: Podiatry

## 2021-05-03 DIAGNOSIS — Q667 Congenital pes cavus, unspecified foot: Secondary | ICD-10-CM

## 2021-05-03 DIAGNOSIS — M7661 Achilles tendinitis, right leg: Secondary | ICD-10-CM | POA: Diagnosis not present

## 2021-05-03 DIAGNOSIS — M21861 Other specified acquired deformities of right lower leg: Secondary | ICD-10-CM

## 2021-05-03 DIAGNOSIS — M9261 Juvenile osteochondrosis of tarsus, right ankle: Secondary | ICD-10-CM

## 2021-05-03 DIAGNOSIS — M9262 Juvenile osteochondrosis of tarsus, left ankle: Secondary | ICD-10-CM

## 2021-05-03 DIAGNOSIS — M21862 Other specified acquired deformities of left lower leg: Secondary | ICD-10-CM | POA: Diagnosis not present

## 2021-05-03 DIAGNOSIS — M722 Plantar fascial fibromatosis: Secondary | ICD-10-CM | POA: Diagnosis not present

## 2021-05-03 MED ORDER — MELOXICAM 15 MG PO TABS: 15.0000 mg | ORAL_TABLET | Freq: Every day | ORAL | 3 refills | Status: AC

## 2021-05-04 ENCOUNTER — Telehealth: Payer: Self-pay | Admitting: Podiatry

## 2021-05-04 NOTE — Telephone Encounter (Signed)
Pt called [facility] to get the mri you recommended scheduled and they told her they have not gotten an orders yet.

## 2021-05-05 ENCOUNTER — Encounter: Payer: Self-pay | Admitting: Podiatry

## 2021-05-05 NOTE — Telephone Encounter (Signed)
Left message that orders are now in system and to call if any further questions.

## 2021-05-05 NOTE — Progress Notes (Signed)
Subjective:  Patient ID: Alexis Ortiz, female    DOB: 28-Apr-1990,  MRN: [id number]  Chief Complaint  Patient presents with   Plantar Fasciitis    Plantar fasciitis, Tendonitis    31 y.o. female presents with the above complaint. History confirmed with patient.  She has been dealing with left foot plantar fasciitis and right foot Achilles tendinitis for some time.  She is transitioning her care from Dr. March Rummage to myself.  She had an MRI completed of the left forefoot in October.  She did 3 weeks of PT with no significant improvement.  This also included dry needling which seemed to make it worse.  They discussed surgery on the right Achilles but wanted the left foot to be better.  Right now her left heel is about a 5 out of 10 in severity whereas it was a an 8 out of 10 previously prior to EPF of the left heel on 03/10/2020.  The Achilles is still an 8 out of 10 and is a primary issue.  Objective:  Physical Exam: warm, good capillary refill, no trophic changes or ulcerative lesions, normal DP and PT pulses, and normal sensory exam. Left Foot: point tenderness over the heel pad and gastrocnemius equinus is noted with a positive silverskiold test Right Foot: tenderness at Achilles tendon insertion and gastrocnemius equinus is noted with a positive silverskiold test  Imaging: I reviewed her prior radiographs dated 11/21/2019 she has a pes cavus foot type with plantar and posterior calcaneal enthesophytes as well as a Haglund's deformity.  Study Result  Narrative & Impression  CLINICAL DATA:  Left foot pain and difficulty walking for 3-4 months. History of prior left foot surgery for plantar fasciitis.   EXAM: MRI OF THE LEFT FOOT WITHOUT CONTRAST   TECHNIQUE: Multiplanar, multisequence MR imaging of the left foot was performed. No intravenous contrast was administered.   COMPARISON:  Radiographs 11/12/2020   FINDINGS: The bony structures are intact. No stress fracture or bone  lesion. The joint spaces are maintained. No significant degenerative changes or erosive findings.   The major tendons and ligaments appear intact. There is thickening of the visualized posterior aspect of the plantar fascia but the hindfoot is not evaluated.   Small fluid collections in the first, second and third web spaces consistent with intermetatarsal bursitis.   The foot musculature is unremarkable.  No muscle tear myositis.   IMPRESSION: 1. No acute bony findings or degenerative changes. 2. Intact major ligaments and tendons. 3. Small fluid collections in the first, second and third web spaces consistent with intermetatarsal bursitis.     Electronically Signed   By: Marijo Sanes M.D.   On: 12/28/2020 09:19     Assessment:   1. Plantar fasciitis   2. Tendonitis, Achilles, right   3. Gastrocnemius equinus of right lower extremity   4. Gastrocnemius equinus of left lower extremity   5. Congenital pes cavus, unspecified foot   6. Haglund's deformity of both heels      Plan:  Patient was evaluated and treated and all questions answered.  I reviewed the etiology and treatment options in her care thus far for both left planter fasciitis as well as Achilles tendinitis on the right side.  I think she has contributing factors of pes cavus foot type with bilateral equinus deformity as well as Haglund's deformity on the right side.  She has not improved despite physical therapy, dry needling immobilization home therapy and a plantar fasciotomy on the left foot.  She has not had an MRI of either heel only an MRI in the left forefoot dated 12/26/2020 which showed no major findings other than some small intermetatarsal bursitis which I do not think is a factor for her.  I recommended bilateral heel MRIs to evaluate for the plantar fascia on the left foot and the integrity of this after her surgery and any recurrence or other issues that may be causing her pain as well as the right  Achilles to evaluate for microtears and plan for possible surgical intervention on either foot.  I will see her back after the MRIs.  In the interim I prescribed her meloxicam for anti-inflammatory relief.  Return for after MRI to review.

## 2021-05-14 ENCOUNTER — Ambulatory Visit
Admission: RE | Admit: 2021-05-14 | Discharge: 2021-05-14 | Disposition: A | Discharge status: Home / Self Care | Payer: Commercial Managed Care - PPO | Source: Ambulatory Visit | Attending: Podiatry | Admitting: Podiatry

## 2021-05-14 ENCOUNTER — Other Ambulatory Visit: Payer: Self-pay

## 2021-05-14 DIAGNOSIS — M9262 Juvenile osteochondrosis of tarsus, left ankle: Secondary | ICD-10-CM

## 2021-05-14 DIAGNOSIS — M9261 Juvenile osteochondrosis of tarsus, right ankle: Secondary | ICD-10-CM

## 2021-05-14 DIAGNOSIS — M722 Plantar fascial fibromatosis: Secondary | ICD-10-CM

## 2021-05-17 ENCOUNTER — Encounter: Payer: Self-pay | Admitting: Podiatry

## 2021-05-17 ENCOUNTER — Ambulatory Visit (INDEPENDENT_AMBULATORY_CARE_PROVIDER_SITE_OTHER): Payer: Commercial Managed Care - PPO | Admitting: Podiatry

## 2021-05-17 ENCOUNTER — Other Ambulatory Visit: Payer: Self-pay

## 2021-05-17 DIAGNOSIS — M21861 Other specified acquired deformities of right lower leg: Secondary | ICD-10-CM

## 2021-05-17 DIAGNOSIS — M7661 Achilles tendinitis, right leg: Secondary | ICD-10-CM

## 2021-05-17 DIAGNOSIS — M9261 Juvenile osteochondrosis of tarsus, right ankle: Secondary | ICD-10-CM | POA: Diagnosis not present

## 2021-05-17 DIAGNOSIS — M9262 Juvenile osteochondrosis of tarsus, left ankle: Secondary | ICD-10-CM

## 2021-05-17 DIAGNOSIS — M722 Plantar fascial fibromatosis: Secondary | ICD-10-CM

## 2021-05-18 ENCOUNTER — Encounter: Payer: Self-pay | Admitting: Podiatry

## 2021-05-18 NOTE — Progress Notes (Signed)
?Subjective:  ?Patient ID: Alexis Ortiz, female    DOB: 1990-08-26,  MRN: [id number] ? ?Follow-up on MRI ? ?31 y.o. female presents with the above complaint. History confirmed with patient.  She has been dealing with left foot plantar fasciitis and right foot Achilles tendinitis for some time.  She is transitioning her care from Dr. March Rummage to myself.  She had an MRI completed of the left forefoot in October.  She did 3 weeks of PT with no significant improvement.  This also included dry needling which seemed to make it worse.  They discussed surgery on the right Achilles but wanted the left foot to be better.  Right now her left heel is about a 5 out of 10 in severity whereas it was a an 8 out of 10 previously prior to EPF of the left heel on 03/10/2020.  The Achilles is still an 8 out of 10 and is a primary issue. ? ? ?Interval history: ?Since last visit she has completed the MRI.  Foot feels about the same and still quite painful.  Most the issue is still the Achilles on the right side. ? ? ?Objective:  ?Physical Exam: ?warm, good capillary refill, no trophic changes or ulcerative lesions, normal DP and PT pulses, and normal sensory exam. ?Left Foot: point tenderness over the heel pad and gastrocnemius equinus is noted with a positive silverskiold test ?Right Foot: tenderness at Achilles tendon insertion and gastrocnemius equinus is noted with a positive silverskiold test, medial bump and palpable Haglund's deformity ? ?Imaging: ?I reviewed her prior radiographs dated 11/21/2019 she has a pes cavus foot type with plantar and posterior calcaneal enthesophytes as well as a Haglund's deformity. ? ?Study Result ? ?Narrative & Impression  ?CLINICAL DATA:  Persistent and recurrent plantar fasciitis ?  ?EXAM: ?MR OF THE LEFT HEEL WITHOUT CONTRAST ?  ?TECHNIQUE: ?Multiplanar, multisequence MR imaging of the left hip was performed. ?No intravenous contrast was administered. ?  ?COMPARISON:  MRI foot 12/26/2020, left  foot radiograph 11/12/2020 ?  ?FINDINGS: ?Bones/Joint/Cartilage ?  ?There is no evidence of acute fracture or dislocation. No ?significant marrow signal alteration. No aggressive osseous lesion. ?Tiny Haglund deformity. Tiny Achilles enthesophyte. Plantar ?calcaneal spur. ?  ?Ligaments ?  ?Intact lateral ankle ligaments. Intact medial ankle ligaments. ?Intact high ankle ligaments. ?  ?Muscles and Tendons ?  ?The peroneal tendons are tact. The posteromedial tendons are intact. ?The anterior tendons are intact. The Achilles tendon is intact ?without tear or significant peritendinitis. There is thickening of ?the central bundle plantar fascia without significant associated ?edema signal. ?  ?Soft tissues ?  ?No focal fluid collection. ?  ?IMPRESSION: ?Thickening of the central bundle plantar fascia proximally without ?associated edema signal suggesting chronic plantar fasciitis. ?  ?  ?Electronically Signed ?  By: Maurine Simmering M.D. ?  On: 05/16/2021 08:07  ? ?Study Result ? ?Narrative & Impression  ?CLINICAL DATA:  Heel pain ?  ?EXAM: ?MR OF THE RIGHT HEEL WITHOUT CONTRAST ?  ?TECHNIQUE: ?Multiplanar, multisequence MR imaging of the right heel was ?performed. No intravenous contrast was administered. ?  ?COMPARISON:  Foot radiograph 11/21/2019 ?  ?FINDINGS: ?Bones/Joint/Cartilage ?  ?The cortex is intact. There is no significant marrow signal ?alteration. There is a tiny Haglund deformity. Plantar calcaneal ?spur and tiny Achilles enthesophyte. ?  ?Ligaments ?  ?Intact lateral, medial, and high ankle ligaments. ?  ?Muscles and Tendons ?  ?The Achilles tendon is intact without evidence of tear or ?significant peritendinitis. There is  no significant fluid in the ?retrocalcaneal bursa. ?  ?The plantar fascia is intact with mild thickening of the central ?bundle but no increased signal suggesting prior plantar fasciitis. ?  ?Intact peroneal tendons. Intact posteromedial tendons. Intact ?anterior tendons. ?  ?Soft tissues ?   ?No focal fluid collection.  No evidence of soft tissue mass. ?  ?IMPRESSION: ?Intact Achilles tendon without evidence of tear, significant ?peritendinitis, or retrocalcaneal bursitis. Tiny Haglund deformity ?of the dorsal calcaneus and Achilles enthesophyte. ?  ?Mild thickening of the central bundle plantar fascia proximally with ?adjacent plantar calcaneal spur suggesting chronic plantar ?fasciitis. ?  ?  ?Electronically Signed ?  By: Maurine Simmering M.D. ?  On: 05/16/2021 08:03  ? ? ?Study Result ? ?Narrative & Impression  ?CLINICAL DATA:  Left foot pain and difficulty walking for 3-4 ?months. History of prior left foot surgery for plantar fasciitis. ?  ?EXAM: ?MRI OF THE LEFT FOOT WITHOUT CONTRAST ?  ?TECHNIQUE: ?Multiplanar, multisequence MR imaging of the left foot was ?performed. No intravenous contrast was administered. ?  ?COMPARISON:  Radiographs 11/12/2020 ?  ?FINDINGS: ?The bony structures are intact. No stress fracture or bone lesion. ?The joint spaces are maintained. No significant degenerative changes ?or erosive findings. ?  ?The major tendons and ligaments appear intact. There is thickening ?of the visualized posterior aspect of the plantar fascia but the ?hindfoot is not evaluated. ?  ?Small fluid collections in the first, second and third web spaces ?consistent with intermetatarsal bursitis. ?  ?The foot musculature is unremarkable.  No muscle tear myositis. ?  ?IMPRESSION: ?1. No acute bony findings or degenerative changes. ?2. Intact major ligaments and tendons. ?3. Small fluid collections in the first, second and third web spaces ?consistent with intermetatarsal bursitis. ?  ?  ?Electronically Signed ?  By: Marijo Sanes M.D. ?  On: 12/28/2020 09:19 ?   ? ?Assessment:  ? ?1. Tendonitis, Achilles, right   ?2. Gastrocnemius equinus of right lower extremity   ?3. Haglund's deformity of both heels   ?4. Plantar fasciitis   ? ? ? ?Plan:  ?Patient was evaluated and treated and all questions  answered. ? ?I reviewed the results of the MRIs on both sides with her in detail.  We discussed further treatment.  She has had quite a bit of nonoperative treatment at this point including mobilization anti-inflammatories physical therapy and thus far none of this is helped alleviate her pain.  She is interested in surgical correction at this point.  Surgical we discussed secondary repair and removal of the disease portion of the Achilles tendon, possible the posterior enthesophyte where it attaches, Haglund's deformity on the right heel and a gastrocnemius recession.  We discussed the risk benefits and potential complications of this procedure including but limited to pain, swelling, infection, scar, numbness which may be temporary or permanent, chronic pain, stiffness, nerve pain or damage, wound healing problems, bone healing problems including delayed or non-union.  We discussed the postoperative course including a period of nonweightbearing required after surgery.  All questions were addressed today.  Informed consent was signed and reviewed she understands and wishes to proceed.  I discussed with her I am hopeful I will not have to completely detach and reattach the Achilles tendon but this would be an intraoperative decision ? ? ?Surgical plan: ? ?Procedure: ?-Right gastrocnemius recession, secondary repair of Achilles with debridement of disease portions, resection of Haglund's deformity and medial bump ? ?Location: ?-[facility] ? ?Anesthesia plan: ?-General anesthesia with regional  block in prone position ? ?Postoperative pain plan: ?- Tylenol 1000 mg every 6 hours, ibuprofen 600 mg every 6 hours, gabapentin 300 mg every 8 hours x5 days, oxycodone 5 mg 1-2 tabs every 6 hours only as needed ? ?DVT prophylaxis: ?-ASA 325 mg twice daily ? ?WB Restrictions / DME needs: ?-NWB in splint or cast postop ? ? ? ?No follow-ups on file.  ? ?

## 2021-06-15 ENCOUNTER — Telehealth: Payer: Self-pay | Admitting: Urology

## 2021-06-15 NOTE — Telephone Encounter (Addendum)
DOS - 07/15/21 ? ?GASTROCNEMIUS RECESS RIGHT ---[phone] ?REPAIR ACHILLES RIGHT --- [phone] ?CALCANEAL OSTECTOMY RIGHT --- 28118 ? ? ?[facility] EFFECTIVE DATE - 03/13/21 ? ? ?PLAN DEDUCTIBLE - $3,500.00 W/ $2,307.88  REMAINING ?OUT OF POCKET - $7,000.00 W/ $7,482.70 REMAINING ?COINSURANCE - 30% ?COPAY - $0.00 ? ? ?SPOKE WITH SUSIE WITH [employer] AND SHE STATED THAT FOR CPT CODES 78675, [phone] AND 10071 NO PRIOR AUTH IS REQUIRED. ? ?REF# [id number] ? ? ?[employer] EFFECTIVE DATE - 09/10/20 ? ?PLAN DEDUCTIBLE - $0.00 ?OUT OF POCKET - Member's individual out-of-pocket maximum has no limit. ?COINSURANCE - 0% ?COPAY - $0.00 ? ?[place] THAT CPT CODES 64158, [phone] AND 76808 HAVE BEEN APPROVED, AUTH # [id number], GOOD FROM 07/15/21 - 10/13/21. ?

## 2021-07-04 ENCOUNTER — Encounter: Payer: Self-pay | Admitting: Podiatry

## 2021-07-04 NOTE — Telephone Encounter (Signed)
Please advise 

## 2021-07-15 ENCOUNTER — Other Ambulatory Visit: Payer: Self-pay | Admitting: Podiatry

## 2021-07-15 DIAGNOSIS — M7731 Calcaneal spur, right foot: Secondary | ICD-10-CM | POA: Diagnosis not present

## 2021-07-15 DIAGNOSIS — M216X1 Other acquired deformities of right foot: Secondary | ICD-10-CM | POA: Diagnosis not present

## 2021-07-15 DIAGNOSIS — M7661 Achilles tendinitis, right leg: Secondary | ICD-10-CM | POA: Diagnosis not present

## 2021-07-15 MED ORDER — ASPIRIN EC 325 MG PO TBEC: 325.0000 mg | DELAYED_RELEASE_TABLET | Freq: Two times a day (BID) | ORAL | 0 refills | Status: AC

## 2021-07-15 MED ORDER — OXYCODONE HCL 5 MG PO TABS
5.0000 mg | ORAL_TABLET | ORAL | 0 refills | Status: DC | PRN
Start: 2021-07-15 — End: 2021-07-19

## 2021-07-15 MED ORDER — GABAPENTIN 300 MG PO CAPS: 300.0000 mg | ORAL_CAPSULE | Freq: Three times a day (TID) | ORAL | 0 refills | Status: AC

## 2021-07-15 MED ORDER — ACETAMINOPHEN 500 MG PO TABS: 1000.0000 mg | ORAL_TABLET | Freq: Four times a day (QID) | ORAL | 0 refills | Status: AC | PRN

## 2021-07-15 MED ORDER — IBUPROFEN 600 MG PO TABS: 600.0000 mg | ORAL_TABLET | Freq: Four times a day (QID) | ORAL | 0 refills | Status: AC | PRN

## 2021-07-15 NOTE — Progress Notes (Signed)
07/15/21 Achilles repair, gastroc recession, calcaneal ostectomy ?

## 2021-07-19 ENCOUNTER — Other Ambulatory Visit: Payer: Self-pay | Admitting: Podiatry

## 2021-07-19 ENCOUNTER — Telehealth: Payer: Self-pay | Admitting: Podiatry

## 2021-07-19 MED ORDER — OXYCODONE HCL 5 MG PO TABS: 5.0000 mg | ORAL_TABLET | ORAL | 0 refills | Status: DC | PRN

## 2021-07-19 NOTE — Telephone Encounter (Signed)
[facility] in [place]    Medication oxyCODONE (OXY IR/ROXICODONE) 5 MG immediate release tablet    Patient requesting a refill on her pain medication , please advise

## 2021-07-20 MED ORDER — OXYCODONE HCL 5 MG PO TABS: 5.0000 mg | ORAL_TABLET | ORAL | 0 refills | Status: DC | PRN

## 2021-07-20 NOTE — Telephone Encounter (Signed)
The patient will have to pay out of pocket(10.97) if she gets today or wait until Saturday and the insurance will cover,spoke with patient and she will pay out of pocket, is completely out at the [facility].

## 2021-07-20 NOTE — Addendum Note (Signed)
Addended bySherryle Lis, Mliss Wedin R on: 07/20/2021 09:57 AM ? ? Modules accepted: Orders ? ?

## 2021-07-20 NOTE — Telephone Encounter (Signed)
States that they never received the medication refill?  Please advise

## 2021-07-21 ENCOUNTER — Ambulatory Visit (INDEPENDENT_AMBULATORY_CARE_PROVIDER_SITE_OTHER): Payer: Commercial Managed Care - PPO

## 2021-07-21 ENCOUNTER — Ambulatory Visit (INDEPENDENT_AMBULATORY_CARE_PROVIDER_SITE_OTHER): Payer: Commercial Managed Care - PPO | Admitting: Podiatry

## 2021-07-21 DIAGNOSIS — M21861 Other specified acquired deformities of right lower leg: Secondary | ICD-10-CM

## 2021-07-21 DIAGNOSIS — M7661 Achilles tendinitis, right leg: Secondary | ICD-10-CM

## 2021-07-21 DIAGNOSIS — M9262 Juvenile osteochondrosis of tarsus, left ankle: Secondary | ICD-10-CM

## 2021-07-21 DIAGNOSIS — M9261 Juvenile osteochondrosis of tarsus, right ankle: Secondary | ICD-10-CM | POA: Diagnosis not present

## 2021-07-21 MED ORDER — CEPHALEXIN 500 MG PO CAPS: 500.0000 mg | ORAL_CAPSULE | Freq: Three times a day (TID) | ORAL | 0 refills | Status: AC

## 2021-07-23 ENCOUNTER — Other Ambulatory Visit: Payer: Self-pay | Admitting: Podiatry

## 2021-07-23 MED ORDER — OXYCODONE HCL 5 MG PO TABS: 5.0000 mg | ORAL_TABLET | ORAL | 0 refills | Status: AC | PRN

## 2021-07-24 NOTE — Progress Notes (Signed)
?  Subjective:  ?Patient ID: Alexis Ortiz, female    DOB: 1991/02/24,  MRN: [id number] ? ?Chief Complaint  ?Patient presents with  ? Routine Post Op  ?    CAST CHANGE(xray) POV #1 DOS 07/15/2021 RT REMOVAL OF BONE SPUR BEHIND HEEL, HAGLUND DEFORMITY RESECTION, REPAIR OF ACHILLES, PRP INJECTION BOTH FEET  ? ? ? ?31 y.o. female returns for post-op check.  Still having pain but seems to be improving slowly ? ?Review of Systems: Negative except as noted in the HPI. Denies N/V/F/Ch. ? ? ?Objective:  ?There were no vitals filed for this visit. ?There is no height or weight on file to calculate BMI. ?Constitutional Well developed. ?Well nourished.  ?Vascular Foot warm and well perfused. ?Capillary refill normal to all digits.  Calf is soft and supple, no posterior calf or knee pain, negative Homans' sign  ?Neurologic Normal speech. ?Oriented to person, place, and time. ?Epicritic sensation to light touch grossly present bilaterally.  ?Dermatologic Skin healing well without signs of infection. Skin edges well coapted without signs of infection.  Minor maceration around the incision and peri-incisional erythema, no cellulitis purulence or dehiscence  ?Orthopedic: Tenderness to palpation noted about the surgical site.  ? ?Multiple view plain film radiographs: Interval resection of posterior and superior calcaneal spurring and Haglund deformity ?Assessment:  ? ?1. Gastrocnemius equinus of right lower extremity   ?2. Haglund's deformity of both heels   ?3. Tendonitis, Achilles, right   ? ?Plan:  ?Patient was evaluated and treated and all questions answered. ? ?S/p foot surgery right ?-Progressing as expected post-operatively. ?-XR: Noted above ?-WB Status: NWB in below-knee cast.  Cast was removed today incision inspected a sterile dressing applied and a new well-padded below the knee cast was applied. ?-Sutures: Plan remove in 2 weeks. ?-Medications: Rx for cephalexin to pharmacy repair incisional erythema.  She will let me  know if she has the need for more pain medication. ? ? ?Return in about 2 weeks (around 08/04/2021) for post op (no x-rays), suture removal, cast change.  ?

## 2021-07-28 ENCOUNTER — Other Ambulatory Visit: Payer: Self-pay | Admitting: Sports Medicine

## 2021-07-29 ENCOUNTER — Telehealth: Payer: Self-pay | Admitting: Podiatry

## 2021-07-29 MED ORDER — OXYCODONE HCL 5 MG PO TABS: 5.0000 mg | ORAL_TABLET | ORAL | 0 refills | Status: DC | PRN

## 2021-07-29 NOTE — Telephone Encounter (Signed)
Medication oxyCODONE (OXY IR/ROXICODONE) 5 MG immediate release tablet   Pharmacy  [facility] in [place]   Patient requesting pain medication refill

## 2021-08-04 ENCOUNTER — Other Ambulatory Visit: Payer: Self-pay | Admitting: Podiatry

## 2021-08-04 ENCOUNTER — Ambulatory Visit (INDEPENDENT_AMBULATORY_CARE_PROVIDER_SITE_OTHER): Payer: Commercial Managed Care - PPO | Admitting: Podiatry

## 2021-08-04 ENCOUNTER — Telehealth: Payer: Self-pay | Admitting: Podiatry

## 2021-08-04 DIAGNOSIS — M21861 Other specified acquired deformities of right lower leg: Secondary | ICD-10-CM | POA: Diagnosis not present

## 2021-08-04 DIAGNOSIS — M9261 Juvenile osteochondrosis of tarsus, right ankle: Secondary | ICD-10-CM | POA: Diagnosis not present

## 2021-08-04 DIAGNOSIS — M9262 Juvenile osteochondrosis of tarsus, left ankle: Secondary | ICD-10-CM

## 2021-08-04 DIAGNOSIS — M7661 Achilles tendinitis, right leg: Secondary | ICD-10-CM | POA: Diagnosis not present

## 2021-08-04 DIAGNOSIS — T8189XA Other complications of procedures, not elsewhere classified, initial encounter: Secondary | ICD-10-CM

## 2021-08-04 MED ORDER — MUPIROCIN 2 % EX OINT: 1.0000 "application " | TOPICAL_OINTMENT | Freq: Two times a day (BID) | CUTANEOUS | 2 refills | Status: AC

## 2021-08-04 MED ORDER — OXYCODONE HCL 5 MG PO TABS: 5.0000 mg | ORAL_TABLET | ORAL | 0 refills | Status: DC | PRN

## 2021-08-04 MED ORDER — GENTAMICIN SULFATE 0.1 % EX CREA
1.0000 | TOPICAL_CREAM | Freq: Two times a day (BID) | CUTANEOUS | 1 refills | Status: AC
Start: 2021-08-04 — End: ?

## 2021-08-04 NOTE — Progress Notes (Signed)
Patient called requesting refill of pain medication.  Refill oxycodone 5 mg every 4 hours #20 as needed pain.  Patient also states that she was supposed to have an antibiotic ointment sent to the pharmacy for her incisional site.  I am not sure what antibiotic ointment was supposed to be sent to the pharmacy so I went ahead and sent in a prescription for gentamicin cream.  Apply daily with a Band-Aid.  Follow-up next scheduled appointment with Dr. Sherryle Lis.  Edrick Kins, DPM [facility]  Dr. Edrick Kins, DPM    [address], [place] [address]                Office [phone]  Fax [phone]

## 2021-08-04 NOTE — Telephone Encounter (Signed)
Refill has now been sent

## 2021-08-04 NOTE — Patient Instructions (Signed)
DO NOT WALK ON THE FOOT YET  Wash the foot daily with Dial soap and apply the mupirocin ointment and a bandage

## 2021-08-04 NOTE — Progress Notes (Addendum)
  Subjective:  Patient ID: Alexis Ortiz, female    DOB: Sep 03, 1990,  MRN: [id number]  Chief Complaint  Patient presents with   Routine Post Op      Cast removal/suture removal/cast applicationPOV #2 DOS 7/0/3500 RT REMOVAL OF BONE SPUR BEHIND HEEL, HAGLUND DEFORMITY RESECTION, REPAIR OF ACHILLES, PRP INJECTION BOTH FEET     31 y.o. female returns for post-op check.  Still having a good bit of pain but again slowly improving.  She is having some random pains happen throughout the back of the heel and side of the foot  Review of Systems: Negative except as noted in the HPI. Denies N/V/F/Ch.   Objective:  There were no vitals filed for this visit. There is no height or weight on file to calculate BMI. Constitutional Well developed. Well nourished.  Vascular Foot warm and well perfused. Capillary refill normal to all digits.  Calf is soft and supple, no posterior calf or knee pain, negative Homans' sign  Neurologic Normal speech. Oriented to person, place, and time. Epicritic sensation to light touch grossly present bilaterally.  No deficits in distribution of sural nerve  Dermatologic Distally in the area of previous maceration there is some delayed healing with slight eschar.  Sutures remain intact.  No dehiscence.  Orthopedic: Tenderness to palpation noted about the surgical site.   Multiple view plain film radiographs: Interval resection of posterior and superior calcaneal spurring and Haglund deformity Assessment:   1. Delayed surgical wound healing, initial encounter   2. Gastrocnemius equinus of right lower extremity   3. Tendonitis, Achilles, right   4. Haglund's deformity of both heels    Plan:  Patient was evaluated and treated and all questions answered.  S/p foot surgery right -Cast was removed today.  The sutures for the proximal incisions and gastrocnemius recession incisions were removed.  Distally she had an area of delayed healing.  We discussed putting her  back into a cast but I think at this point it be better to put her into a removable boot and she can provide local wound care with mupirocin ointment and a bandage. -Rx for mupirocin sent to pharmacy.  Also refilled oxycodone 20 tablets take every 6 hours as needed -Discussed with her she has not put weight on the boot for any reason which she understands she will continue using the knee scooter -CAM boot was dispensed with plantarflexion wedges to maintain plantarflexion of the Achilles tendon -I will see her back before the next visit to evaluate her incision.  We are still on track for beginning of weightbearing at week 6  Return in about 12 days (around 08/16/2021) for wound check / post op (no XR).

## 2021-08-04 NOTE — Telephone Encounter (Signed)
Pt was seen today and is calling about medication. States that a cream to apply to her stitches and pain medication was to be sent to pharmacy.  [place], [place] Phone:  [phone]  Fax:  [phone]     Pt went by but medication hasnt been sent as of yet.  Please advise.

## 2021-08-10 ENCOUNTER — Other Ambulatory Visit: Payer: Self-pay | Admitting: Podiatry

## 2021-08-10 MED ORDER — OXYCODONE HCL 5 MG PO TABS: 5.0000 mg | ORAL_TABLET | ORAL | 0 refills | Status: DC | PRN

## 2021-08-15 ENCOUNTER — Ambulatory Visit (INDEPENDENT_AMBULATORY_CARE_PROVIDER_SITE_OTHER): Payer: Commercial Managed Care - PPO | Admitting: Podiatry

## 2021-08-15 ENCOUNTER — Encounter: Payer: Self-pay | Admitting: Podiatry

## 2021-08-15 DIAGNOSIS — M62461 Contracture of muscle, right lower leg: Secondary | ICD-10-CM

## 2021-08-15 DIAGNOSIS — M7661 Achilles tendinitis, right leg: Secondary | ICD-10-CM

## 2021-08-15 DIAGNOSIS — M9262 Juvenile osteochondrosis of tarsus, left ankle: Secondary | ICD-10-CM

## 2021-08-15 DIAGNOSIS — M21861 Other specified acquired deformities of right lower leg: Secondary | ICD-10-CM

## 2021-08-15 DIAGNOSIS — T8189XA Other complications of procedures, not elsewhere classified, initial encounter: Secondary | ICD-10-CM

## 2021-08-15 DIAGNOSIS — M9261 Juvenile osteochondrosis of tarsus, right ankle: Secondary | ICD-10-CM

## 2021-08-15 MED ORDER — IBUPROFEN 600 MG PO TABS: 600.0000 mg | ORAL_TABLET | Freq: Four times a day (QID) | ORAL | 0 refills | Status: AC | PRN

## 2021-08-15 NOTE — Progress Notes (Signed)
    Subjective:  Patient ID: Alexis Ortiz, female    DOB: [birth year],  MRN: [id number]  Chief Complaint  Patient presents with   Routine Post Op      POV #3 DOS 07/15/2021 RT REMOVAL OF BONE SPUR BEHIND HEEL, HAGLUND DEFORMITY RESECTION, REPAIR OF ACHILLES, PRP INJECTION BOTH FEET     31 y.o. female returns for post-op check.  Seems to be improving she has been using the ointment and changing daily as directed.  Has not been putting weight on it.  Still wearing the boot.  Feels like she is having still quite a bit of numbness on the lateral foot  Review of Systems: Negative except as noted in the HPI. Denies N/V/F/Ch.   Objective:  There were no vitals filed for this visit. There is no height or weight on file to calculate BMI. Constitutional Well developed. Well nourished.  Vascular Foot warm and well perfused. Capillary refill normal to all digits.  Calf is soft and supple, no posterior calf or knee pain, negative Homans' sign  Neurologic Normal speech. Oriented to person, place, and time. She has some reduced sensation to epicritic sensation in the duration of the sural nerve and superficial peroneal nerve, not in the deep peroneal, there is no Tinel's sign notable to palpation in any area in the foot or ankle.  No signs of CRPS type I or II.  Dermatologic Distally in the area of previous maceration there is some delayed healing with slight eschar.  Sutures remain intact.  No dehiscence.  Orthopedic: Tenderness to palpation noted about the surgical site.   Multiple view plain film radiographs: Interval resection of posterior and superior calcaneal spurring and Haglund deformity    Assessment:   1. Gastrocnemius equinus of right lower extremity   2. Tendonitis, Achilles, right   3. Haglund's deformity of both heels   4. Delayed surgical wound healing, initial encounter    Plan:  Patient was evaluated and treated and all questions answered.  S/p foot surgery  right -Continue NWB in CAM boot -Continue daily dressing changes with mupirocin ointment and bandage. -Remaining sutures were removed today and the wound is stable.  Areas of delayed healing were debrided in an excisional manner. -I sent her a refill of ibuprofen 600 mg -She still having some numbness and discomfort in the lateral and dorsal foot and ankle.  Appears to be consistent with postoperative neuritis, no specific deficits noted in the sural nerve itself.  I do not think her symptoms are consistent with a specific nerve injury or deficits, it is diffuse and spread throughout the sural and superficial peroneal nerve distributions and there are no incisions that would impact her superficial peroneal nerve.  Expect this to resolve with time but will continue to monitor closely  Return in about 2 weeks (around 08/29/2021) for post op (no x-rays), wound check .

## 2021-08-16 ENCOUNTER — Other Ambulatory Visit: Payer: Self-pay | Admitting: Podiatry

## 2021-08-17 MED ORDER — OXYCODONE HCL 5 MG PO TABS: 5.0000 mg | ORAL_TABLET | ORAL | 0 refills | Status: DC | PRN

## 2021-08-19 ENCOUNTER — Encounter: Payer: Self-pay | Admitting: Podiatry

## 2021-08-23 ENCOUNTER — Encounter: Payer: Commercial Managed Care - PPO | Admitting: Podiatry

## 2021-08-23 ENCOUNTER — Other Ambulatory Visit: Payer: Self-pay | Admitting: Podiatry

## 2021-08-23 MED ORDER — OXYCODONE HCL 5 MG PO TABS: 5.0000 mg | ORAL_TABLET | ORAL | 0 refills | Status: DC | PRN

## 2021-08-25 ENCOUNTER — Encounter: Payer: Commercial Managed Care - PPO | Admitting: Podiatry

## 2021-08-30 ENCOUNTER — Encounter: Payer: Self-pay | Admitting: Podiatry

## 2021-08-30 ENCOUNTER — Ambulatory Visit (INDEPENDENT_AMBULATORY_CARE_PROVIDER_SITE_OTHER): Payer: Commercial Managed Care - PPO | Admitting: Podiatry

## 2021-08-30 ENCOUNTER — Other Ambulatory Visit: Payer: Self-pay | Admitting: Podiatry

## 2021-08-30 DIAGNOSIS — M21861 Other specified acquired deformities of right lower leg: Secondary | ICD-10-CM

## 2021-08-30 DIAGNOSIS — M7661 Achilles tendinitis, right leg: Secondary | ICD-10-CM

## 2021-08-30 DIAGNOSIS — M21862 Other specified acquired deformities of left lower leg: Secondary | ICD-10-CM

## 2021-08-30 DIAGNOSIS — M722 Plantar fascial fibromatosis: Secondary | ICD-10-CM

## 2021-08-30 DIAGNOSIS — M9261 Juvenile osteochondrosis of tarsus, right ankle: Secondary | ICD-10-CM

## 2021-08-30 DIAGNOSIS — M9262 Juvenile osteochondrosis of tarsus, left ankle: Secondary | ICD-10-CM

## 2021-08-30 NOTE — Patient Instructions (Addendum)
You may begin walking in the boot. In 2 weeks (July 4), remove the top wedge. After this day you may drive in a shoe (wear a supportive sneaker). 2 weeks after that (July 18) remove the remaining wedges   Call [phone] to schedule PT

## 2021-08-30 NOTE — Progress Notes (Signed)
    Subjective:  Patient ID: Alexis Ortiz, female    DOB: 1990-09-28,  MRN: [id number]  Chief Complaint  Patient presents with   Routine Post Op     POV #4 DOS 07/15/2021 RT REMOVAL OF BONE SPUR BEHIND HEEL, HAGLUND DEFORMITY RESECTION, REPAIR OF ACHILLES, PRP INJECTION BOTH FEET. 2 weeks (around 08/29/2021) for post op (no x-rays), wound check     31 y.o. female returns for post-op check.  Doing okay.  Still having some pain and sensitivity along the incision.  Review of Systems: Negative except as noted in the HPI. Denies N/V/F/Ch.   Objective:  There were no vitals filed for this visit. There is no height or weight on file to calculate BMI. Constitutional Well developed. Well nourished.  Vascular Foot warm and well perfused. Capillary refill normal to all digits.  Calf is soft and supple, no posterior calf or knee pain, negative Homans' sign  Neurologic Normal speech. Oriented to person, place, and time. The previously reduced sensation to epicritic stimulation has improved in the distribution of the sural nerve and superficial peroneal nerve, there is no Tinel's sign notable to palpation in any area in the foot or ankle.  No signs of CRPS type I or II.  Dermatologic Area of delayed healing has now healed with overlying scab  Orthopedic: Tenderness to palpation noted about the surgical site.  Sensitivity over the scar   Multiple view plain film radiographs: Interval resection of posterior and superior calcaneal spurring and Haglund deformity    Assessment:   1. Tendonitis, Achilles, right   2. Gastrocnemius equinus of right lower extremity   3. Haglund's deformity of both heels   4. Gastrocnemius equinus of left lower extremity   5. Plantar fasciitis    Plan:  Patient was evaluated and treated and all questions answered.  S/p foot surgery right -May begin to [facility] in the cam boot with 3 wedges.  In 2 weeks she will move the uppermost wedge.  2 weeks after that she  remove the remaining wedges and moved to a 90 degree angle -Continue daily dressing changes with mupirocin ointment can leave open to air at this point -Begin physical therapy.  Referral was sent to [facility] in [place] which she will begin.  She may transition her care to the office here in [place], she may return to work and I wrote her a letter for this as well -Regarding her pain and overall symptoms they do continue to improve.  I discussed with her that she did have major surgery and we are only 6 weeks out from the procedure and most of this is to be expected.  As long as she makes continual gradual progress then that should be a successful operation.  Return in about 6 weeks (around 10/11/2021) for post op (no x-rays).

## 2021-08-31 MED ORDER — OXYCODONE HCL 5 MG PO TABS: 5.0000 mg | ORAL_TABLET | ORAL | 0 refills | Status: AC | PRN

## 2021-09-12 ENCOUNTER — Encounter: Payer: Self-pay | Admitting: Podiatry

## 2021-09-12 ENCOUNTER — Encounter (INDEPENDENT_AMBULATORY_CARE_PROVIDER_SITE_OTHER): Payer: Self-pay

## 2021-09-14 ENCOUNTER — Telehealth: Payer: Self-pay | Admitting: Podiatry

## 2021-09-14 MED ORDER — OXYCODONE HCL 5 MG PO TABS: 5.0000 mg | ORAL_TABLET | ORAL | 0 refills | Status: AC | PRN

## 2021-09-14 NOTE — Telephone Encounter (Signed)
Patient would like to extend her leave until 7/25, returning to work on 7/26 due to continued foot pain when driving.

## 2021-09-14 NOTE — Telephone Encounter (Signed)
Hi, I tried giving you a callback but your voicemail was full. I will send a message over to Dr. Sherryle Lis about you needing a extension on returning to work due to your foot pain and the extended drive to work.

## 2021-09-15 ENCOUNTER — Encounter: Payer: Self-pay | Admitting: Podiatry

## 2021-09-28 MED ORDER — OXYCODONE HCL 5 MG PO TABS: 5.0000 mg | ORAL_TABLET | ORAL | 0 refills | Status: DC | PRN

## 2021-09-30 IMAGING — US US EXTREM LOW*L* LIMITED
1 series · 10 of 10 positions shown · non-contrast
Comparison: None.

CLINICAL DATA: Left foot mass

EXAM:
ULTRASOUND LEFT LOWER EXTREMITY LIMITED
TECHNIQUE: Ultrasound examination of the lower extremity soft tissues was
performed in the area of clinical concern.

[Series 1: us extrem low*left* limited · 0.05mm/px · 10 acquisitions, 10 frames shown]
[im 1/10]
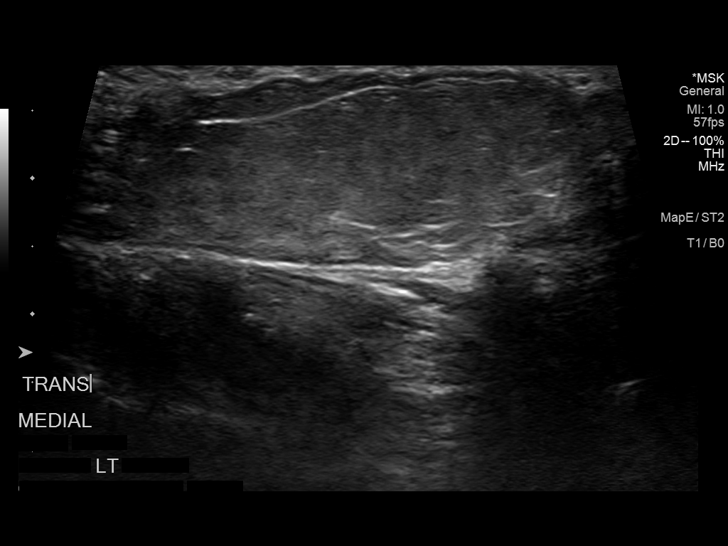
[im 2/10]
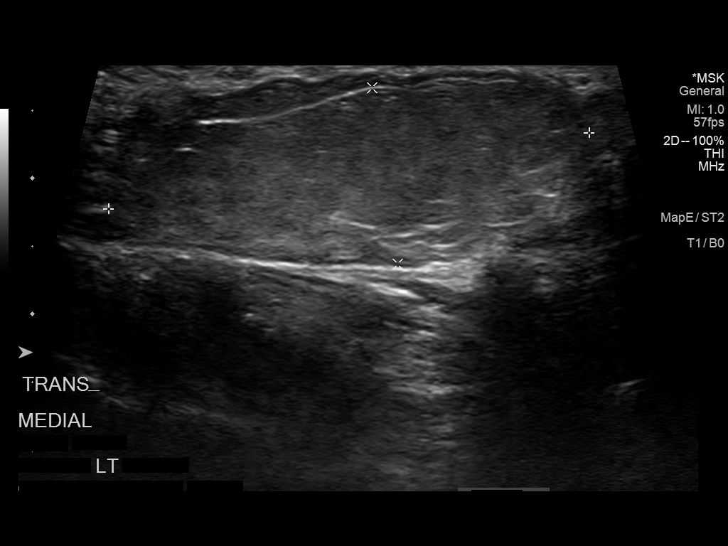
[im 3/10]
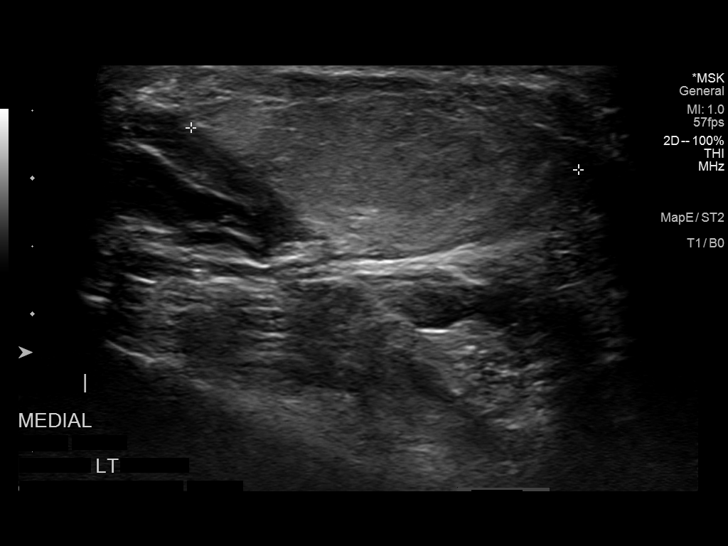
[im 4/10]
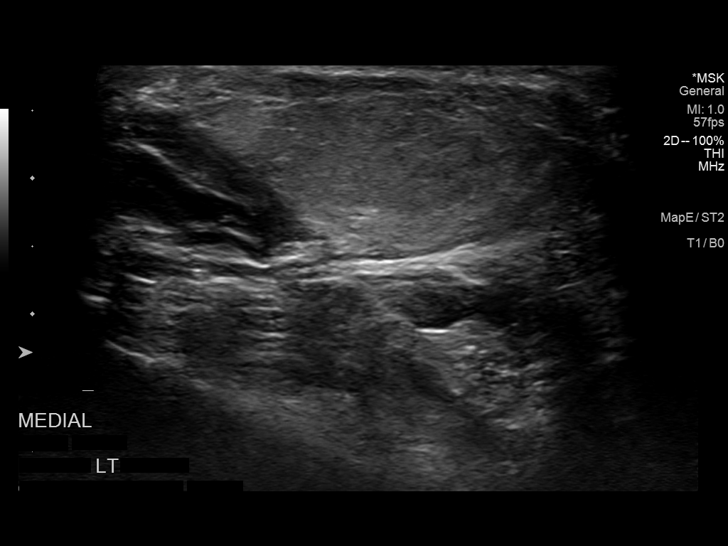
[im 5/10]
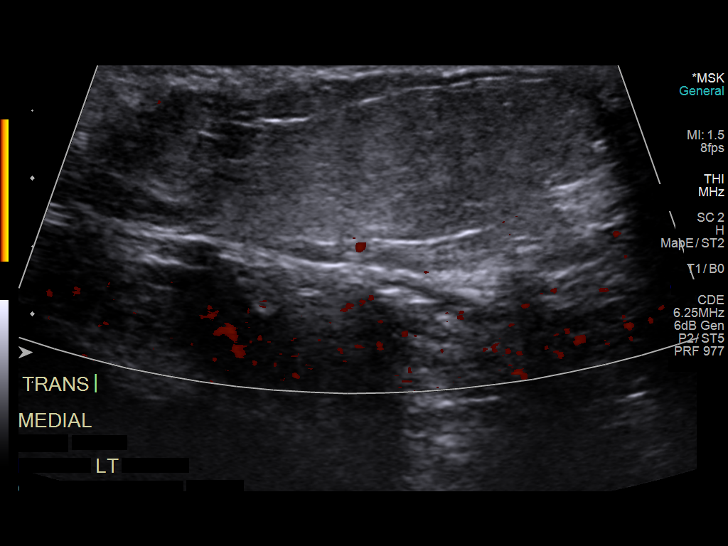
[im 6/10]
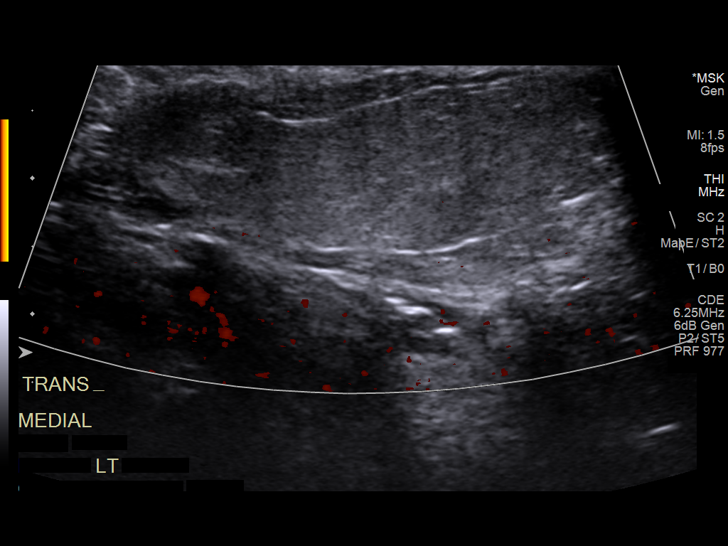
[im 7/10]
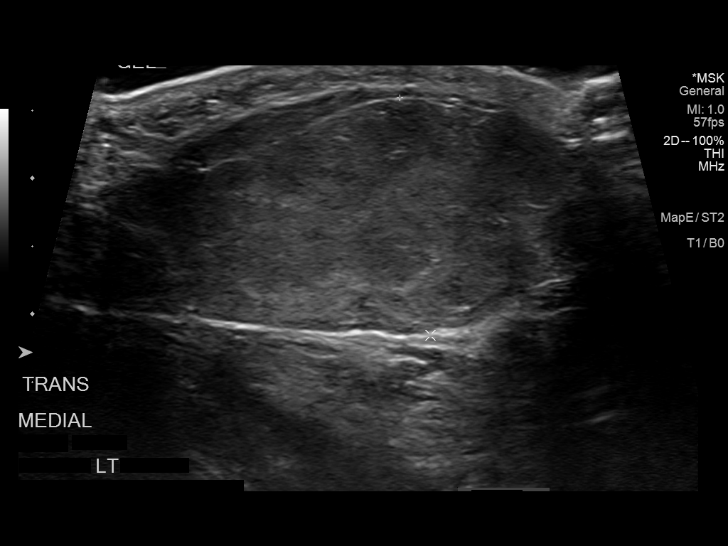
[im 8/10]
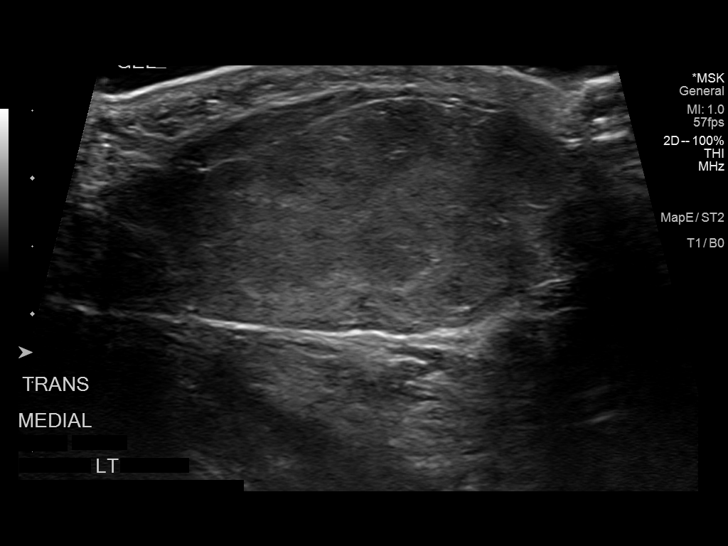
[im 9/10]
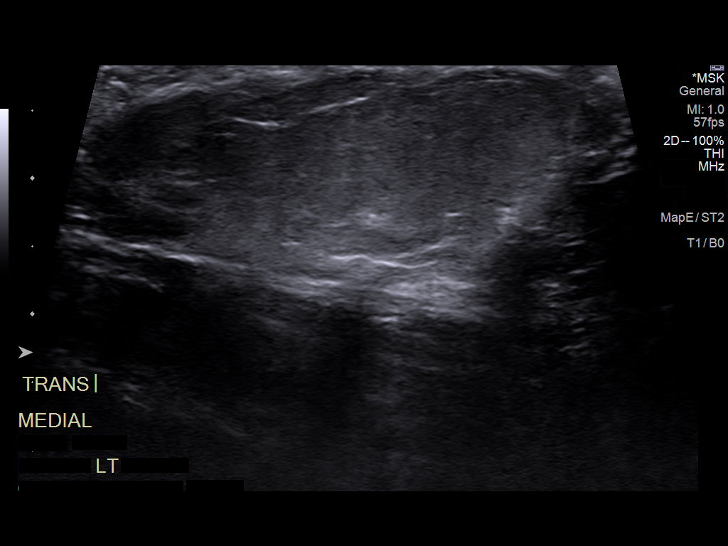
[im 10/10]
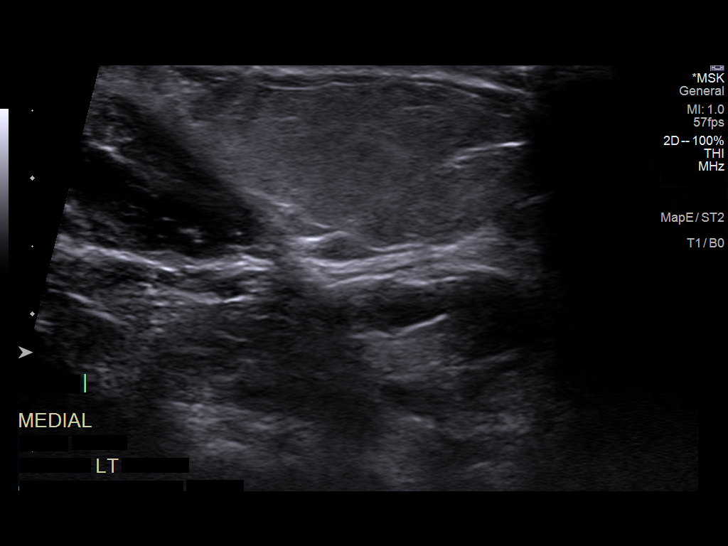

[10 of 10 positions shown; findings below may reference images not displayed]

FINDINGS: 2.9 x 3.6 x 1.3 cm echogenic soft tissue mass in the subcutaneous
fat with well-defined margins along the medial aspect of the left
foot most consistent with a lipoma. No other solid or cystic mass.
IMPRESSION: 2.9 x 3.6 x 1.3 cm mass in the subcutaneous fat of the medial aspect
of the left foot most consistent with a lipoma.

## 2021-10-05 ENCOUNTER — Other Ambulatory Visit: Payer: Self-pay | Admitting: Podiatry

## 2021-10-06 MED ORDER — OXYCODONE HCL 5 MG PO TABS: 5.0000 mg | ORAL_TABLET | ORAL | 0 refills | Status: DC | PRN

## 2021-10-06 NOTE — Telephone Encounter (Signed)
Please advise 

## 2021-10-11 ENCOUNTER — Encounter: Payer: Self-pay | Admitting: Podiatry

## 2021-10-11 ENCOUNTER — Encounter: Payer: Commercial Managed Care - PPO | Admitting: Podiatry

## 2021-10-12 ENCOUNTER — Encounter: Payer: Self-pay | Admitting: Podiatry

## 2021-10-13 ENCOUNTER — Other Ambulatory Visit: Payer: Self-pay | Admitting: Podiatry

## 2021-10-13 ENCOUNTER — Telehealth: Payer: Self-pay

## 2021-10-13 NOTE — Telephone Encounter (Addendum)
oxyCODONE (OXY IR/ROXICODONE) 5 MG immediate release tablet    Prior authorization was started today on 10/13/21.  10-13-21- Per [employer] website, [facility] does not handle this review. PA had to be completed through [email or web address] and we are waiting on a response from that website.

## 2021-10-14 NOTE — Telephone Encounter (Signed)
10/14/21- PA was approved from 10/13/21-10/27/2021

## 2021-10-21 MED ORDER — OXYCODONE HCL 5 MG PO TABS: 5.0000 mg | ORAL_TABLET | Freq: Three times a day (TID) | ORAL | 0 refills | Status: AC | PRN

## 2021-10-21 NOTE — Telephone Encounter (Signed)
Patient called back to see if she can get her pain meds sent to her pharmacy before she goes out of town tomorrow. Looks like the Prior Auth was approved.

## 2021-10-21 NOTE — Telephone Encounter (Signed)
Rx sent 

## 2021-10-21 NOTE — Addendum Note (Signed)
Addended bySherryle Lis, Laikynn Pollio R on: 10/21/2021 03:17 PM   Modules accepted: Orders

## 2021-11-01 ENCOUNTER — Ambulatory Visit (INDEPENDENT_AMBULATORY_CARE_PROVIDER_SITE_OTHER): Payer: Commercial Managed Care - PPO | Admitting: Podiatry

## 2021-11-01 DIAGNOSIS — M722 Plantar fascial fibromatosis: Secondary | ICD-10-CM | POA: Diagnosis not present

## 2021-11-01 DIAGNOSIS — M7661 Achilles tendinitis, right leg: Secondary | ICD-10-CM | POA: Diagnosis not present

## 2021-11-01 DIAGNOSIS — M21861 Other specified acquired deformities of right lower leg: Secondary | ICD-10-CM | POA: Diagnosis not present

## 2021-11-01 NOTE — Progress Notes (Signed)
    Subjective:  Patient ID: Hilton Cork, female    DOB: 10-25-1990,  MRN: [id number]  Chief Complaint  Patient presents with   Routine Post Op      POV #5 DOS 07/15/2021 RT REMOVAL OF BONE SPUR BEHIND HEEL, HAGLUND DEFORMITY RESECTION, REPAIR OF ACHILLES, PRP INJECTION BOTH FEET. 2 weeks (around 08/29/2021) for post op (no x-rays), wound check     31 y.o. female returns for post-op check.  Overall doing better.  She feels like in the right foot.  She is been using the boot.  She notes pain some in the left heel, describes it as 4 out of 10 in severity  Review of Systems: Negative except as noted in the HPI. Denies N/V/F/Ch.   Objective:  There were no vitals filed for this visit. There is no height or weight on file to calculate BMI. Constitutional Well developed. Well nourished.  Vascular Foot warm and well perfused. Capillary refill normal to all digits.  Calf is soft and supple, no posterior calf or knee pain, negative Homans' sign  Neurologic Normal speech. Oriented to person, place, and time. Some numbness and sensitivity along the scar, no gross neurologic deficits..  No signs of CRPS type I or II.  Dermatologic It is well-healed and not hypertrophic  Orthopedic: Minimal tenderness.  5 out of 5 strength.  No edema.  Tenderness at the medial band of the plantar fascia insertion on the plantar heel left foot   Multiple view plain film radiographs: Interval resection of posterior and superior calcaneal spurring and Haglund deformity    Assessment:   1. Plantar fasciitis   2. Tendonitis, Achilles, right   3. Gastrocnemius equinus of right lower extremity     Plan:  Patient was evaluated and treated and all questions answered.  S/p foot surgery right -Right foot is doing very well she may transition back to regular shoe gear, I have no further restrictions for her at this point she may do activity and shoe gear as tolerated   Planter fasciitis left I reviewed her  previous imaging studies with the MRI taken last spring and reviewed that she does have plantar fasciitis.  I reviewed Dr. Eleanora Neighbor operative note and he previously did a medial band only release of the plantar fascia.  Repeat fasciotomy may offer some benefit but I would hesitate on this for now.  Instead I recommend she obtain better supportive shoes such as [employer]'s or Rolena Infante which she will do, may consider custom molded orthoses if this is not helpful within 1 month.  We will prefer to try this before looking at repeat surgery.  Return in about 3 months (around 02/01/2022) for surgery f/u .

## 2021-11-01 NOTE — Patient Instructions (Signed)
Supportive shoes I recommend: Toy Baker (I wear the [place] 8), On Running ([employer])

## 2021-11-23 ENCOUNTER — Encounter: Payer: Self-pay | Admitting: Podiatry

## 2021-11-23 NOTE — Telephone Encounter (Signed)
Noted, thanks!

## 2021-11-24 ENCOUNTER — Telehealth: Payer: Self-pay | Admitting: Podiatry

## 2021-11-24 NOTE — Telephone Encounter (Signed)
Pt didn't check her voice mail till now (10:30)and realized we called yesterday 9/13 to get her in at 9:00am. When would you like the pt to be seen?

## 2021-11-28 NOTE — Telephone Encounter (Signed)
I have scheduled this patient with Dr. Amalia Hailey this week and then she can follow up with Dr.McDonald

## 2021-11-29 ENCOUNTER — Encounter: Payer: Self-pay | Admitting: Podiatry

## 2021-11-29 ENCOUNTER — Ambulatory Visit (INDEPENDENT_AMBULATORY_CARE_PROVIDER_SITE_OTHER): Payer: Commercial Managed Care - PPO | Admitting: Podiatry

## 2021-11-29 DIAGNOSIS — M722 Plantar fascial fibromatosis: Secondary | ICD-10-CM

## 2021-11-29 MED ORDER — IBUPROFEN 600 MG PO TABS: 600.0000 mg | ORAL_TABLET | Freq: Three times a day (TID) | ORAL | 1 refills | Status: DC | PRN

## 2021-11-29 NOTE — Progress Notes (Signed)
   Chief Complaint  Patient presents with   left foot pain    Patient is here for left foot pain.patient states that she has plantar fasciitis she thinks it may be getting worse, messaged Dr. Sherryle Lis last Tuesday.    Subjective: 31 y.o. female presenting for evaluation of left heel pain.  She was last seen in the office on 11/01/2021.  She is being followed closely by Dr. Sherryle Lis for postoperative recovery of right posterior heel spur resection with Achilles tendon repair.  DOS: 07/15/2021.  Patient states that she has dealt with plantar fasciitis intermittently for several years.  She is concerned because of continued pain to the left plantar heel.  She also states that the pain extends up the posterior aspect of the leg into the Achilles tendon.   No past medical history on file. No past surgical history on file. No Known Allergies   Objective: Physical Exam General: The patient is alert and oriented x3 in no acute distress.  Dermatology: Skin is warm, dry and supple bilateral lower extremities. Negative for open lesions or macerations bilateral.   Vascular: Dorsalis Pedis and Posterior Tibial pulses palpable bilateral.  Capillary fill time is immediate to all digits.  Neurological: Epicritic and protective threshold intact bilateral.   Musculoskeletal: Tenderness to palpation to the plantar aspect of the left heel along the plantar fascia. All other joints range of motion within normal limits bilateral. Strength 5/5 in all groups bilateral.     Assessment: 1. Plantar fasciitis left foot  Plan of Care:  1. Patient evaluated.  2.  In regards to the plantar fasciitis, the patient declined cortisone injection 3.  Refill prescription for Motrin 600 mg 3 times daily 4.  Plantar fascial brace dispensed.  Wear daily as needed to see if this helps support the medial longitudinal arch of the foot and offload pressure 5.  Continue wearing good supportive sneakers 6.  We also discussed  different treatment modalities including physical therapy, which the patient has already pursued with minimal improvement, custom molded orthotics, PRP injections.  Due to the cost of these modalities the patient declined.   7.  Return to clinic next scheduled appointment with Dr. Merri Brunette, DPM [facility]  Dr. Edrick Kins, DPM    [address], [place] [address]                Office [phone]  Fax [phone]

## 2021-12-19 ENCOUNTER — Ambulatory Visit (INDEPENDENT_AMBULATORY_CARE_PROVIDER_SITE_OTHER): Payer: Commercial Managed Care - PPO | Admitting: Podiatry

## 2021-12-19 ENCOUNTER — Encounter: Payer: Self-pay | Admitting: Podiatry

## 2021-12-19 DIAGNOSIS — M722 Plantar fascial fibromatosis: Secondary | ICD-10-CM

## 2021-12-19 NOTE — Progress Notes (Signed)
  Subjective:  Patient ID: Alexis Ortiz, female    DOB: Sep 03, 1990,  MRN: [id number]  Chief Complaint  Patient presents with   Plantar Fasciitis    Left foot follow up, saw Dr Amalia Hailey at last visit. He gave her a PF brace, but it has not helped much    30 y.o. female presents with the above complaint. History confirmed with patient.  Right side is doing well.  The left heel is still very tender and painful.  She previously had open instep fasciotomy with Dr. March Rummage.  At last visit Dr. Amalia Hailey placed her in a plantar fascia brace this has not helped much.  She does not wear inserts in her shoes  Objective:  Physical Exam: warm, good capillary refill, no trophic changes or ulcerative lesions, normal DP and PT pulses, normal sensory exam, and right heel doing well minimal tenderness, left plantar heel pain on the insertion of the medial band of the plantar fascia at the calcaneus, she has gastrocnemius equinus as well.   Previous left heel MRI 05/14/2021 shows thickening of the medial band of the plantar fascia, appears to be continuous throughout its length  Assessment:   1. Plantar fasciitis, left      Plan:  Patient was evaluated and treated and all questions answered.  Reviewed my clinical exam findings.  She does have continued Planter fasciitis.  Her last MRI in March of this year does show thickening of the plantar fascia on the left foot.  Has not responded well to injections or therapy.  We discussed custom molded foot orthoses which I think long-term will be helpful for her to reduce the inflammation and support the foot and prevent recurrence.  She will check with her insurance regarding her possible benefits of this.  We also discussed surgical treatment with revisional endoscopic plantar fasciotomy as well as gastrocnemius recession.  She will consider this for the future.  I will see her back at her scheduled visit in November for this and she will let me know what she would like  to proceed with.  No follow-ups on file.

## 2022-01-31 ENCOUNTER — Ambulatory Visit: Payer: Commercial Managed Care - PPO | Admitting: Podiatry

## 2022-02-27 ENCOUNTER — Ambulatory Visit: Payer: Commercial Managed Care - PPO | Admitting: Podiatry

## 2022-03-22 ENCOUNTER — Ambulatory Visit: Payer: Commercial Managed Care - PPO | Admitting: Podiatry

## 2022-04-04 ENCOUNTER — Other Ambulatory Visit: Payer: Self-pay | Admitting: Podiatry

## 2022-04-05 ENCOUNTER — Ambulatory Visit (INDEPENDENT_AMBULATORY_CARE_PROVIDER_SITE_OTHER): Payer: Commercial Managed Care - PPO | Admitting: Podiatry

## 2022-04-05 ENCOUNTER — Encounter: Payer: Self-pay | Admitting: Podiatry

## 2022-04-05 DIAGNOSIS — M62462 Contracture of muscle, left lower leg: Secondary | ICD-10-CM

## 2022-04-05 DIAGNOSIS — M722 Plantar fascial fibromatosis: Secondary | ICD-10-CM | POA: Diagnosis not present

## 2022-04-08 ENCOUNTER — Encounter: Payer: Self-pay | Admitting: Podiatry

## 2022-04-08 NOTE — Progress Notes (Signed)
  Subjective:  Patient ID: Alexis Ortiz, female    DOB: September 09, 1990,  MRN: [id number]  Chief Complaint  Patient presents with   Plantar Fasciitis    Left foot follow up, saw Dr Amalia Hailey at last visit. He gave her a PF brace, but it has not helped much    32 y.o. female presents with the above complaint. History confirmed with patient.  Right side is doing well still occasional soreness here as well as a left heel feels very tight  Objective:  Physical Exam: warm, good capillary refill, no trophic changes or ulcerative lesions, normal DP and PT pulses, normal sensory exam, and right heel doing well minimal tenderness, left plantar heel pain on the insertion of the medial band of the plantar fascia at the calcaneus, she has gastrocnemius equinus as well.   Previous left heel MRI 05/14/2021 shows thickening of the medial band of the plantar fascia, appears to be continuous throughout its length  Assessment:   1. Plantar fasciitis, left      Plan:  Patient was evaluated and treated and all questions answered.  Right side is doing fairly well, I discussed with her I expect her maximal improvement from will be a year out from surgery.  I have no further physical restrictions for her still on this side.  We discussed further treatment for the left side, I think she likely will end up needing and benefiting from endoscopic gastrinomas recession and plantar fasciotomy.  She would likely plan for this in the fall she will return for surgical planning visit when she is ready to schedule this.  No follow-ups on file.

## 2022-08-02 ENCOUNTER — Other Ambulatory Visit: Payer: Self-pay | Admitting: Podiatry

## 2022-09-04 ENCOUNTER — Ambulatory Visit (INDEPENDENT_AMBULATORY_CARE_PROVIDER_SITE_OTHER): Payer: Commercial Managed Care - PPO | Admitting: Podiatry

## 2022-09-04 DIAGNOSIS — B353 Tinea pedis: Secondary | ICD-10-CM

## 2022-09-04 MED ORDER — CLOTRIMAZOLE-BETAMETHASONE 1-0.05 % EX CREA
1.0000 | TOPICAL_CREAM | Freq: Every day | CUTANEOUS | 0 refills | Status: DC
Start: 2022-09-04 — End: 2023-12-14

## 2022-09-04 NOTE — Progress Notes (Unsigned)
       Chief Complaint  Patient presents with   Nail Problem    Possible fungus in between toes patient states she is pregnant and think she got in from a nail salon     HPI: 32 y.o. female presenting today with concern of an itchy rash on the left foot only.  States this began developing after she had gotten a pedicure at a new facility.  She does note that she is pregnant.  She has had intense itching to the point that she had to take a Benadryl last night.  Notes that its mostly isolated around the third, fourth and fifth toes.  No past medical history on file.  No past surgical history on file.  No Known Allergies   Physical Exam: General: The patient is alert and oriented x3 in no acute distress.  Dermatology: Skin is warm, dry and supple bilateral lower extremities.  There is peeling and mild maceration and erythema in the second third and fourth interspaces of the left foot.  Remainder of skin is intact.  no vesicle formation is noted  Vascular: Palpable pedal pulses bilaterally. Capillary refill within normal limits.  No appreciable edema.  No erythema or calor.  Assessment/Plan of Care: 1. Tinea pedis of left foot     Meds ordered this encounter  Medications   clotrimazole-betamethasone (LOTRISONE) cream    Sig: Apply 1 Application topically daily.    Dispense:  30 g    Refill:  0   Discussed clinical findings with patient today.  Due to the intense itching that she has been experiencing, will start her off with Lotrisone cream for twice daily application between the toes and along the sulcus of those toes plantarly.  Once the itching resolves depending on how much the skin has improved we may need to switch to an antifungal without the betamethasone component.  She understood.  She was advised to apply this medication for the next 2 to 4 weeks until resolution is noted.  If no improvement in 1 week she is to call the office.   Clerance Lav, DPM, FACFAS [facility]     [address] [place], [place] [address]                Office [phone]  Fax [phone]

## 2022-09-05 ENCOUNTER — Ambulatory Visit: Payer: Commercial Managed Care - PPO | Admitting: Podiatry

## 2023-07-30 ENCOUNTER — Other Ambulatory Visit: Payer: Self-pay | Admitting: Podiatry

## 2023-07-30 ENCOUNTER — Encounter: Payer: Self-pay | Admitting: Podiatry

## 2023-07-30 MED ORDER — CLOTRIMAZOLE-BETAMETHASONE 1-0.05 % EX CREA: 1.0000 | TOPICAL_CREAM | Freq: Every day | CUTANEOUS | 0 refills | Status: DC

## 2023-08-07 ENCOUNTER — Ambulatory Visit: Admitting: Podiatry

## 2023-08-14 ENCOUNTER — Ambulatory Visit: Admitting: Podiatry

## 2023-08-28 ENCOUNTER — Ambulatory Visit: Admitting: Podiatry

## 2023-12-12 ENCOUNTER — Encounter: Payer: Self-pay | Admitting: Podiatry

## 2023-12-14 MED ORDER — CLOTRIMAZOLE-BETAMETHASONE 1-0.05 % EX CREA: 1.0000 | TOPICAL_CREAM | Freq: Every day | CUTANEOUS | 2 refills | Status: AC

## 2023-12-27 ENCOUNTER — Ambulatory Visit: Admitting: Podiatry

## 2024-01-24 ENCOUNTER — Ambulatory Visit: Admitting: Podiatry

## 2024-03-03 ENCOUNTER — Ambulatory Visit: Admitting: Podiatry

## 2024-03-12 ENCOUNTER — Ambulatory Visit

## 2024-03-12 ENCOUNTER — Ambulatory Visit (INDEPENDENT_AMBULATORY_CARE_PROVIDER_SITE_OTHER): Admitting: Podiatry

## 2024-03-12 ENCOUNTER — Ambulatory Visit (INDEPENDENT_AMBULATORY_CARE_PROVIDER_SITE_OTHER)

## 2024-03-12 DIAGNOSIS — B353 Tinea pedis: Secondary | ICD-10-CM | POA: Diagnosis not present

## 2024-03-12 DIAGNOSIS — G5791 Unspecified mononeuropathy of right lower limb: Secondary | ICD-10-CM

## 2024-03-12 DIAGNOSIS — M79671 Pain in right foot: Secondary | ICD-10-CM

## 2024-03-12 MED ORDER — TERBINAFINE HCL 250 MG PO TABS: 250.0000 mg | ORAL_TABLET | Freq: Every day | ORAL | 0 refills | Status: AC

## 2024-03-12 NOTE — Progress Notes (Signed)
"  °  Subjective:  Patient ID: Alexis Ortiz, female    DOB: [birth year],  MRN: [id number]  Chief Complaint  Patient presents with   Foot Pain    RM 4 Patient is here for right foot pain. Pt states in right heel. Pain has been present for the past 4-6 weeks.    33 y.o. female presents with the above complaint. History confirmed with patient.  Having new and different pain on the right heel more on the medial side than it is posterior.  No recent injury.  Feels Akin burning tingling stinging pain.  Also still dealing with Dry itchy skin on the left foot has not improved with the Lotrisone  cream so far  Objective:  Physical Exam: warm, good capillary refill, no trophic changes or ulcerative lesions, normal DP and PT pulses, normal sensory exam, and right heel has pain medially none on the posterior heel, minimal on the plantar fascia, maximal area of pain is to palpation along the medial calcaneal nerve and she has a positive Tinel's sign distally with percussion.  On her left foot she has dry itchy peeling skin in the digital sulcus and interdigital spaces.  No evidence of Achilles rupture.   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 Peripheral neuritis of right foot   2. Tinea pedis of left foot      Plan:  Patient was evaluated and treated and all questions answered.  Regarding her right foot we discussed multiple possibilities including the possibility of hydrolysis of the sonic anchors causing some inflammation.  There is no evidence of rupture.  Most of her pain is medially along the medial plantar calcaneal nerve.  Recommend a corticosteroid injection to alleviate inflammation for possible nerve entrapment.  We discussed the risks and benefits and following consent the medial heel along the plantar calcaneal  nerve and medial branch was injected with 6 mg of dexamethasone phosphate as well as 0.5 cc of 0.5% Marcaine plain.  We also discussed her tinea pedis on the left foot that is chronic and recurrent Lotrisone  cream has not alleviated I recommend oral therapy with 30-day course of Lamisil this was sent to pharmacy we discussed the possible side effects.  Follow-up as needed for this.  Follow-up in 6 weeks to reevaluate.  If still painful would recommend MRI on right heel to evaluate further.  No follow-ups on file.   "

## 2024-04-28 ENCOUNTER — Ambulatory Visit: Admitting: Podiatry
